# Patient Record
Sex: Female | Born: 1995 | Race: Black or African American | Hispanic: No | Marital: Single | Smoking: Never smoker
Health system: Southern US, Community
[De-identification: ages and names within clinical notes are randomized; demographics above are authoritative.]

## PROBLEM LIST (undated history)

## (undated) DIAGNOSIS — F32A Depression, unspecified: Secondary | ICD-10-CM

## (undated) DIAGNOSIS — J329 Chronic sinusitis, unspecified: Secondary | ICD-10-CM

## (undated) DIAGNOSIS — D509 Iron deficiency anemia, unspecified: Secondary | ICD-10-CM

## (undated) DIAGNOSIS — F411 Generalized anxiety disorder: Secondary | ICD-10-CM

## (undated) DIAGNOSIS — F419 Anxiety disorder, unspecified: Secondary | ICD-10-CM

## (undated) DIAGNOSIS — R42 Dizziness and giddiness: Secondary | ICD-10-CM

## (undated) HISTORY — PX: MOUTH SURGERY: SHX715

## (undated) HISTORY — DX: Depression, unspecified: F32.A

## (undated) HISTORY — PX: OPEN REDUCTION INTERNAL FIXATION (ORIF) HAND: SHX5991

## (undated) HISTORY — DX: Iron deficiency anemia, unspecified: D50.9

## (undated) HISTORY — DX: Anxiety disorder, unspecified: F41.9

## (undated) HISTORY — DX: Generalized anxiety disorder: F41.1

## (undated) HISTORY — PX: CHOLECYSTECTOMY: SHX55

---
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
## 2017-02-28 ENCOUNTER — Encounter: Payer: Self-pay | Admitting: Neurology

## 2017-02-28 ENCOUNTER — Ambulatory Visit (INDEPENDENT_AMBULATORY_CARE_PROVIDER_SITE_OTHER): Payer: Managed Care, Other (non HMO) | Admitting: Neurology

## 2017-02-28 ENCOUNTER — Encounter (INDEPENDENT_AMBULATORY_CARE_PROVIDER_SITE_OTHER): Payer: Self-pay

## 2017-02-28 VITALS — BP 112/75 | HR 83 | Ht 62.0 in | Wt 141.5 lb

## 2017-02-28 DIAGNOSIS — R42 Dizziness and giddiness: Secondary | ICD-10-CM | POA: Insufficient documentation

## 2017-02-28 NOTE — Progress Notes (Signed)
Reason for visit: Vertigo  Referring physician: Dr. Bernerd Limboucker  Chelsea Lowery is a 21 y.o. female  History of present illness:  Chelsea Lowery is an 21 year old right-handed black female with a history of onset of vertigo that began around Easter of 2018. The patient indicated that the episodes are relatively frequent at first, but the episodes have become much less frequent and may occur once a week or so. The episodes last several hours, and are associated with true vertigo, nausea, and generalized sensation of weakness. The patient may lay down and take a nap which seems to help. The patient reports no headache whatsoever before, during, or after the events. She does not have a prior history of headache. The patient may have a feeling of near syncope. 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 The patient has been followed through ENT. She reports no difficulty with balance except during episodes of dizziness, and she denies any problems controlling the bowels or the bladder. The patient has not had any syncope. She reports no loss of vision, double vision, or distortion of vision. She has no slurred speech or difficulty swallowing.  Past Medical History:  Diagnosis Date  . Sinusitis     Past Surgical History:  Procedure Laterality Date  . MOUTH SURGERY    . ORTHOPEDIC SURGERY Left 2009   arm    History reviewed. No pertinent family history.  Social history:  reports that she has never smoked. She has never used smokeless tobacco. She reports that she does not drink alcohol. Her drug history is not on file.  Medications:  Prior to Admission medications   Medication Sig Start Date End Date Taking? Authorizing Provider  cetirizine (ZYRTEC) 10 MG chewable tablet Chew 10 mg by mouth as needed.    Yes [provider]  fluticasone  (FLONASE) 50 MCG/ACT nasal spray Place 2 sprays into both nostrils as needed.    Yes [provider]  ibuprofen (ADVIL,MOTRIN) 200 MG tablet Take 800 mg by mouth as needed.    Yes [provider]  levonorgestrel-ethinyl estradiol (MARLISSA) 0.15-30 MG-MCG tablet Take 1 tablet by mouth daily. 05/16/16  Yes [provider]      Allergies  Allergen Reactions  . Clindamycin/Lincomycin   . Doxycycline Nausea And Vomiting  . Percocet [Oxycodone-Acetaminophen]     ROS:  Out of a complete 14 system review of symptoms, the patient complains only of the following symptoms, and all other reviewed systems are negative.  Fatigue Hearing loss, ringing in the ears, spinning sensations Numbness, weakness, dizziness Anxiety, not enough sleep, decreased energy Insomnia, sleepiness  Blood pressure 112/75, pulse 83, height 5\' 2"  (1.575 m), weight 141 lb 8 oz (64.2 kg).  Physical Exam  General: The patient is alert and cooperative at the time of the examination.  Eyes: Pupils are equal, round, and reactive to light. Discs are flat bilaterally.  Ears: Tympanic membranes are clear bilaterally.  Neck: The neck is supple, no carotid bruits are noted.  Respiratory: The respiratory examination is clear.  Cardiovascular: The cardiovascular examination reveals a regular rate and rhythm, no obvious murmurs or rubs are noted.  Skin: Extremities are without significant edema.  Neurologic Exam  Mental status: The patient is alert and oriented x 3 at the time of the examination. The patient has apparent normal recent and remote memory,  with an apparently normal attention span and concentration ability.  Cranial nerves: Facial symmetry is present. There is good sensation of the face to pinprick and soft touch bilaterally. The strength of the facial muscles and the muscles to head turning and shoulder shrug are normal bilaterally. Speech is well enunciated, no aphasia or dysarthria  is noted. Extraocular movements are full. Visual fields are full. The tongue is midline, and the patient has symmetric elevation of the soft palate. No obvious hearing deficits are noted.  Motor: The motor testing reveals 5 over 5 strength of all 4 extremities. Good symmetric motor tone is noted throughout.  Sensory: Sensory testing is intact to pinprick, soft touch, vibration sensation, and position sense on all 4 extremities. No evidence of extinction is noted.  Coordination: Cerebellar testing reveals good finger-nose-finger and heel-to-shin bilaterally.  Gait and station: Gait is normal. Tandem gait is normal. Romberg is negative. No drift is seen.  Reflexes: Deep tendon reflexes are symmetric and normal bilaterally. Toes are downgoing bilaterally.   Assessment/Plan:  1. Episodic vertigo  The patient palate does not have any evidence of a peripheral vestibular dysfunction by examination. The patient will undergo MRI of the brain to exclude demyelinating disease. If the episodes of dizziness continue, we will consider treatment with Topamax for possible migraine equivalent events. The patient will follow-up in 4 months. The clinical examination today is normal.  Marlan Palau MD 02/28/2017 11:50 AM  [facility] [address] [place], [place] [address]  Phone [phone] Fax [phone]

## 2017-02-28 NOTE — Patient Instructions (Signed)
    We will check MRI of the brain. 

## 2017-03-19 ENCOUNTER — Other Ambulatory Visit: Payer: Managed Care, Other (non HMO)

## 2017-03-22 ENCOUNTER — Other Ambulatory Visit: Payer: Managed Care, Other (non HMO)

## 2017-04-02 ENCOUNTER — Ambulatory Visit
Admission: RE | Admit: 2017-04-02 | Discharge: 2017-04-02 | Disposition: A | Discharge status: Home / Self Care | Payer: Managed Care, Other (non HMO) | Source: Ambulatory Visit | Attending: Neurology | Admitting: Neurology

## 2017-04-02 DIAGNOSIS — R42 Dizziness and giddiness: Secondary | ICD-10-CM | POA: Diagnosis not present

## 2017-04-02 MED ORDER — GADOBENATE DIMEGLUMINE 529 MG/ML IV SOLN
13.0000 mL | Freq: Once | INTRAVENOUS | Status: AC | PRN
  Administered 2017-04-02: 13 mL via INTRAVENOUS

## 2017-04-03 ENCOUNTER — Telehealth: Payer: Self-pay | Admitting: Neurology

## 2017-04-03 MED ORDER — TOPIRAMATE 25 MG PO TABS: ORAL_TABLET | ORAL | 3 refills | Status: DC

## 2017-04-03 NOTE — Telephone Encounter (Signed)
  I called patient. MRI the brain is normal. We will treat the patient empirically for migraine equivalent as an etiology for the vertigo. I will start Topamax. The patient will call for any dose adjustments.  MRI brain 04/02/17:  IMPRESSION:   This is a normal MRI of the brain with and without contrast.

## 2017-08-06 ENCOUNTER — Ambulatory Visit: Payer: Managed Care, Other (non HMO) | Admitting: Neurology

## 2020-03-12 ENCOUNTER — Encounter (HOSPITAL_COMMUNITY): Payer: Self-pay | Admitting: *Deleted

## 2020-03-12 ENCOUNTER — Inpatient Hospital Stay (HOSPITAL_COMMUNITY)
Admission: AD | Admit: 2020-03-12 | Discharge: 2020-03-12 | Disposition: A | Discharge status: Home / Self Care | Payer: No Typology Code available for payment source | Attending: Obstetrics & Gynecology | Admitting: Obstetrics & Gynecology

## 2020-03-12 DIAGNOSIS — Z881 Allergy status to other antibiotic agents status: Secondary | ICD-10-CM | POA: Diagnosis not present

## 2020-03-12 DIAGNOSIS — Z885 Allergy status to narcotic agent status: Secondary | ICD-10-CM | POA: Diagnosis not present

## 2020-03-12 DIAGNOSIS — R1110 Vomiting, unspecified: Secondary | ICD-10-CM | POA: Diagnosis present

## 2020-03-12 DIAGNOSIS — O219 Vomiting of pregnancy, unspecified: Secondary | ICD-10-CM | POA: Insufficient documentation

## 2020-03-12 DIAGNOSIS — Z3A01 Less than 8 weeks gestation of pregnancy: Secondary | ICD-10-CM | POA: Diagnosis not present

## 2020-03-12 HISTORY — DX: Dizziness and giddiness: R42

## 2020-03-12 LAB — URINALYSIS, ROUTINE W REFLEX MICROSCOPIC
Bilirubin Urine: NEGATIVE
Glucose, UA: NEGATIVE mg/dL
Hgb urine dipstick: NEGATIVE
Ketones, ur: NEGATIVE mg/dL
Nitrite: NEGATIVE
Protein, ur: NEGATIVE mg/dL
Specific Gravity, Urine: 1.018 (ref 1.005–1.030)
pH: 6 (ref 5.0–8.0)

## 2020-03-12 LAB — POCT PREGNANCY, URINE: Preg Test, Ur: POSITIVE — AB

## 2020-03-12 MED ORDER — PROMETHAZINE HCL 25 MG/ML IJ SOLN
25.0000 mg | Freq: Once | INTRAMUSCULAR | Status: AC
  Administered 2020-03-12: 20:00:00 25 mg via INTRAMUSCULAR
  Filled 2020-03-12: qty 1

## 2020-03-12 MED ORDER — SCOPOLAMINE 1 MG/3DAYS TD PT72
1.0000 | MEDICATED_PATCH | TRANSDERMAL | Status: DC
  Administered 2020-03-12: 20:00:00 1.5 mg via TRANSDERMAL
  Filled 2020-03-12: qty 1

## 2020-03-12 MED ORDER — PROMETHAZINE HCL 25 MG PO TABS: 25.0000 mg | ORAL_TABLET | Freq: Four times a day (QID) | ORAL | 0 refills | Status: DC | PRN

## 2020-03-12 NOTE — MAU Note (Signed)
Hx of vertigo, was taking Meclizine.  Having n/v.  Hasn't been able to eat for a couple days, can't even work.  Wakes up when trying to sleep, feeling nausea.  Has tried some OTC meds, nothing is helping. Has had initial/confirmation of pregnancy. Was instructed to come here.

## 2020-03-12 NOTE — MAU Provider Note (Addendum)
History     CSN: [id number]  Arrival date and time: 03/12/20 1724   First Provider Initiated Contact with Patient 03/12/20 1833      Chief Complaint  Patient presents with  . Emesis  . Nausea  . Dizziness   HPI   Chelsea Lowery is a 24 y.o. female G1P0000  @[redacted]w[redacted]d  here in MAU with N/v. The symptoms started when she found out she was pregnant. The symptoms worsened today. She has had nausea everyday and today she started vomiting. She has vomited 2x today. She has tried some OTC medications meclozine which is not helping. She has no pain or bleeding.   OB History     Gravida  1   Para  0   Term  0   Preterm  0   AB  0   Living  0      SAB  0   TAB  0   Ectopic  0   Multiple  0   Live Births  0           Past Medical History:  Diagnosis Date  . Sinusitis   . Vertigo     Past Surgical History:  Procedure Laterality Date  . MOUTH SURGERY    . OPEN REDUCTION INTERNAL FIXATION (ORIF) HAND    . ORTHOPEDIC SURGERY Left 2009   arm    History reviewed. No pertinent family history.  Social History   Tobacco Use  . Smoking status: Never Smoker  . Smokeless tobacco: Never Used  Vaping Use  . Vaping Use: Never used  Substance Use Topics  . Alcohol use: Not Currently  . Drug use: Yes    Types: Marijuana    Comment: last used approx  03-06-20    Allergies:  Allergies  Allergen Reactions  . Clindamycin/Lincomycin   . Doxycycline Nausea And Vomiting  . Percocet [Oxycodone-Acetaminophen]     Medications Prior to Admission  Medication Sig Dispense Refill Last Dose  . cetirizine (ZYRTEC) 10 MG chewable tablet Chew 10 mg by mouth as needed.    Past Month at Unknown time  . fluticasone (FLONASE) 50 MCG/ACT nasal spray Place 2 sprays into both nostrils as needed.    Past Month at Unknown time  . Prenatal Vit-Fe Fumarate-FA (PRENATAL MULTIVITAMIN) TABS tablet Take 1 tablet by mouth daily at 12 noon.   03/12/2020 at Unknown time  . ibuprofen (ADVIL,MOTRIN)  200 MG tablet Take 800 mg by mouth as needed.      03/14/2020 levonorgestrel-ethinyl estradiol (MARLISSA) 0.15-30 MG-MCG tablet Take 1 tablet by mouth daily.     01-25-2004 topiramate (TOPAMAX) 25 MG tablet Take one tablet at night for one week, then take 2 tablets at night 60 tablet 3    Results for orders placed or performed during the hospital encounter of 03/12/20 (from the past 48 hour(s))  Pregnancy, urine POC     Status: Abnormal   Collection Time: 03/12/20  6:26 PM  Result Value Ref Range   Preg Test, Ur POSITIVE (A) NEGATIVE    Comment:        THE SENSITIVITY OF THIS METHODOLOGY IS >24 mIU/mL    Review of Systems  Constitutional: Negative for fever.  Gastrointestinal: Positive for nausea and vomiting. Negative for abdominal pain.  Genitourinary: Negative for vaginal bleeding.   Physical Exam   Blood pressure 126/63, pulse (!) 106, temperature 98.4 F (36.9 C), temperature source Oral, resp. rate 18, height 5\' 3"  (1.6 m), weight 193 lb 12.8 oz (87.9  kg), last menstrual period 01/31/2020, SpO2 100 %.  Physical Exam Vitals and nursing note reviewed.  Constitutional:      General: She is not in acute distress.    Appearance: Normal appearance. She is not ill-appearing.  HENT:     Head: Normocephalic.  Pulmonary:     Effort: Pulmonary effort is normal.  Musculoskeletal:        General: Normal range of motion.  Skin:    General: Skin is warm.  Neurological:     Mental Status: She is alert.  Psychiatric:        Mood and Affect: Mood normal.    MAU Course  Procedures  Results for orders placed or performed during the hospital encounter of 03/12/20 (from the past 24 hour(s))  Pregnancy, urine POC     Status: Abnormal   Collection Time: 03/12/20  6:26 PM  Result Value Ref Range   Preg Test, Ur POSITIVE (A) NEGATIVE  Urinalysis, Routine w reflex microscopic     Status: Abnormal   Collection Time: 03/12/20  6:27 PM  Result Value Ref Range   Color, Urine YELLOW YELLOW   APPearance  CLEAR CLEAR   Specific Gravity, Urine 1.018 1.005 - 1.030   pH 6.0 5.0 - 8.0   Glucose, UA NEGATIVE NEGATIVE mg/dL   Hgb urine dipstick NEGATIVE NEGATIVE   Bilirubin Urine NEGATIVE NEGATIVE   Ketones, ur NEGATIVE NEGATIVE mg/dL   Protein, ur NEGATIVE NEGATIVE mg/dL   Nitrite NEGATIVE NEGATIVE   Leukocytes,Ua SMALL (A) NEGATIVE   RBC / HPF 0-5 0 - 5 RBC/hpf   WBC, UA 6-10 0 - 5 WBC/hpf   Bacteria, UA RARE (A) NONE SEEN   Squamous Epithelial / LPF 0-5 0 - 5   Mucus PRESENT      MDM  Delay in urine results Scopolamine patch applied Phenergan 25 mg IM given Waiting for UA results.  Report given to Jorje Guild NP who resumes care of the patient at [facility], [name], NP  No vomiting in MAU U/a shows no indication of dehydration Reports improvement in symptoms after IM phenergan & would like Rx for it  Assessment and Plan  A: 1. Nausea and vomiting during pregnancy prior to [redacted] weeks gestation    P: Discharge home Rx phenergan Discussed reasons to return to MAU Keep appointment with OB  Jorje Guild, NP

## 2020-03-12 NOTE — Discharge Instructions (Signed)

## 2020-04-04 ENCOUNTER — Other Ambulatory Visit: Payer: Self-pay

## 2020-04-04 ENCOUNTER — Inpatient Hospital Stay (HOSPITAL_COMMUNITY): Payer: No Typology Code available for payment source

## 2020-04-04 ENCOUNTER — Inpatient Hospital Stay (HOSPITAL_COMMUNITY)
Admission: AD | Admit: 2020-04-04 | Discharge: 2020-04-04 | Disposition: A | Discharge status: Home / Self Care | Payer: No Typology Code available for payment source | Attending: Obstetrics and Gynecology | Admitting: Obstetrics and Gynecology

## 2020-04-04 ENCOUNTER — Encounter (HOSPITAL_COMMUNITY): Payer: Self-pay | Admitting: Obstetrics and Gynecology

## 2020-04-04 DIAGNOSIS — O99322 Drug use complicating pregnancy, second trimester: Secondary | ICD-10-CM | POA: Insufficient documentation

## 2020-04-04 DIAGNOSIS — O209 Hemorrhage in early pregnancy, unspecified: Secondary | ICD-10-CM

## 2020-04-04 DIAGNOSIS — R109 Unspecified abdominal pain: Secondary | ICD-10-CM | POA: Diagnosis not present

## 2020-04-04 DIAGNOSIS — Z3A09 9 weeks gestation of pregnancy: Secondary | ICD-10-CM | POA: Insufficient documentation

## 2020-04-04 DIAGNOSIS — F1290 Cannabis use, unspecified, uncomplicated: Secondary | ICD-10-CM | POA: Insufficient documentation

## 2020-04-04 DIAGNOSIS — Z79899 Other long term (current) drug therapy: Secondary | ICD-10-CM | POA: Diagnosis not present

## 2020-04-04 DIAGNOSIS — O3441 Maternal care for other abnormalities of cervix, first trimester: Secondary | ICD-10-CM | POA: Insufficient documentation

## 2020-04-04 DIAGNOSIS — N841 Polyp of cervix uteri: Secondary | ICD-10-CM | POA: Diagnosis not present

## 2020-04-04 DIAGNOSIS — O418X10 Other specified disorders of amniotic fluid and membranes, first trimester, not applicable or unspecified: Secondary | ICD-10-CM | POA: Diagnosis not present

## 2020-04-04 DIAGNOSIS — O4691 Antepartum hemorrhage, unspecified, first trimester: Secondary | ICD-10-CM

## 2020-04-04 DIAGNOSIS — O26891 Other specified pregnancy related conditions, first trimester: Secondary | ICD-10-CM | POA: Diagnosis not present

## 2020-04-04 DIAGNOSIS — O468X1 Other antepartum hemorrhage, first trimester: Secondary | ICD-10-CM

## 2020-04-04 DIAGNOSIS — O4690 Antepartum hemorrhage, unspecified, unspecified trimester: Secondary | ICD-10-CM

## 2020-04-04 DIAGNOSIS — O208 Other hemorrhage in early pregnancy: Secondary | ICD-10-CM | POA: Diagnosis present

## 2020-04-04 LAB — WET PREP, GENITAL
Clue Cells Wet Prep HPF POC: NONE SEEN
Sperm: NONE SEEN
Trich, Wet Prep: NONE SEEN
Yeast Wet Prep HPF POC: NONE SEEN

## 2020-04-04 LAB — HCG, QUANTITATIVE, PREGNANCY: hCG, Beta Chain, Quant, S: 121999 m[IU]/mL — ABNORMAL HIGH (ref <5)

## 2020-04-04 LAB — CBC
HCT: 38.9 % (ref 36.0–46.0)
Hemoglobin: 12.2 g/dL (ref 12.0–15.0)
MCH: 26.1 pg (ref 26.0–34.0)
MCHC: 31.4 g/dL (ref 30.0–36.0)
MCV: 83.1 fL (ref 80.0–100.0)
Platelets: 352 10*3/uL (ref 150–400)
RBC: 4.68 MIL/uL (ref 3.87–5.11)
RDW: 12.8 % (ref 11.5–15.5)
WBC: 8.5 10*3/uL (ref 4.0–10.5)
nRBC: 0 % (ref 0.0–0.2)

## 2020-04-04 LAB — ABO/RH: ABO/RH(D): O POS

## 2020-04-04 NOTE — Discharge Instructions (Signed)

## 2020-04-04 NOTE — MAU Provider Note (Signed)
History     CSN: [id number]  Arrival date and time: 04/04/20 0320   First Provider Initiated Contact with Patient 04/04/20 [phone]      Chief Complaint  Patient presents with   Vaginal Bleeding   Chelsea Lowery is a 24 y.o. G1P0 at [redacted]w[redacted]d who receives care at [facility].  She presents today for Vaginal Bleeding.  She reports she was in the bathroom having a bowel movement and noticed blood in the toilet upon completion.  Patient states that she further noticed blood upon wiping.  She reports some "minor" right side cramping that started around the same time of the bleeding which was 0230. Patient reports some dark blood upon arrival to the MAU and the cramps continue, but are improving.  Patient denies sexual activity in the past 3 days.    OB History    Gravida  1   Para  0   Term  0   Preterm  0   AB  0   Living  0     SAB  0   TAB  0   Ectopic  0   Multiple  0   Live Births  0           Past Medical History:  Diagnosis Date   Sinusitis    Vertigo     Past Surgical History:  Procedure Laterality Date   MOUTH SURGERY     OPEN REDUCTION INTERNAL FIXATION (ORIF) HAND     ORTHOPEDIC SURGERY Left 2009   arm    No family history on file.  Social History   Tobacco Use   Smoking status: Never Smoker   Smokeless tobacco: Never Used  Vaping Use   Vaping Use: Never used  Substance Use Topics   Alcohol use: Not Currently   Drug use: Not Currently    Types: Marijuana    Comment: last used approx  03-06-20    Allergies:  Allergies  Allergen Reactions   Clindamycin/Lincomycin    Doxycycline Nausea And Vomiting   Percocet [Oxycodone-Acetaminophen]     Medications Prior to Admission  Medication Sig Dispense Refill Last Dose   ondansetron (ZOFRAN) 4 MG tablet Take 4 mg by mouth every 8 (eight) hours as needed for nausea or vomiting.   04/04/2020 at 0000   Prenatal Vit-Fe Fumarate-FA (PRENATAL MULTIVITAMIN) TABS tablet Take 1  tablet by mouth daily at 12 noon.   04/04/2020 at Unknown time   cetirizine (ZYRTEC) 10 MG chewable tablet Chew 10 mg by mouth as needed.    More than a month at Unknown time   fluticasone (FLONASE) 50 MCG/ACT nasal spray Place 2 sprays into both nostrils as needed.    More than a month at Unknown time   promethazine (PHENERGAN) 25 MG tablet Take 1 tablet (25 mg total) by mouth every 6 (six) hours as needed for nausea or vomiting. 30 tablet 0     Review of Systems  Constitutional: Negative for chills and fever.  Respiratory: Negative for cough and shortness of breath.   Gastrointestinal: Positive for abdominal pain (Cramping), constipation, nausea (Taking zofran-no current nausea) and vomiting (This morning-relieved with Zofran. ). Negative for diarrhea.  Genitourinary: Positive for vaginal bleeding. Negative for difficulty urinating, dysuria, pelvic pain and vaginal discharge.  Musculoskeletal: Negative for back pain.  Neurological: Positive for dizziness. Negative for light-headedness and headaches.   Physical Exam   Blood pressure (!) 122/49, pulse 86, temperature 98.6 F (37 C), resp. rate 18, last  menstrual period 01/31/2020, SpO2 100 %.  Physical Exam Exam conducted with a chaperone present.  Constitutional:      Appearance: Normal appearance.  HENT:     Head: Normocephalic and atraumatic.  Eyes:     Conjunctiva/sclera: Conjunctivae normal.  Cardiovascular:     Rate and Rhythm: Normal rate and regular rhythm.  Pulmonary:     Effort: Pulmonary effort is normal. No respiratory distress.  Genitourinary:    Labia:        Right: No tenderness or lesion.        Left: No tenderness or lesion.      Vagina: Vaginal discharge present. No bleeding.     Cervix: No cervical motion tenderness, discharge, friability, erythema or cervical bleeding.     Uterus: Enlarged.      Comments: Speculum Exam: -Normal External Genitalia: Non tender, no apparent discharge at introitus.  -Vaginal  Vault: Pink mucosa with good rugae. Scant amt thick whitish pink discharge -wet prep collected -Cervix:Pink, no lesions or cysts. Small flat red Polyp noted protruding from os. Os appears closed and without active bleeding. Small 21mm clot noted-GC/CT collected -Bimanual Exam:  Uterine size c/w dates.  No tenderness.   Musculoskeletal:        General: Normal range of motion.     Cervical back: Normal range of motion.  Skin:    General: Skin is warm and dry.  Neurological:     Mental Status: She is alert and oriented to person, place, and time.  Psychiatric:        Mood and Affect: Mood normal.        Behavior: Behavior normal.        Thought Content: Thought content normal.     MAU Course  Procedures Results for orders placed or performed during the hospital encounter of 04/04/20 (from the past 24 hour(s))  Wet prep, genital     Status: Abnormal   Collection Time: 04/04/20  4:29 AM  Result Value Ref Range   Yeast Wet Prep HPF POC NONE SEEN NONE SEEN   Trich, Wet Prep NONE SEEN NONE SEEN   Clue Cells Wet Prep HPF POC NONE SEEN NONE SEEN   WBC, Wet Prep HPF POC MANY (A) NONE SEEN   Sperm NONE SEEN   ABO/Rh     Status: None   Collection Time: 04/04/20  4:36 AM  Result Value Ref Range   ABO/RH(D) O POS    No rh immune globuloin      NOT A RH IMMUNE GLOBULIN CANDIDATE, PT RH POSITIVE Performed at [facility], [address] [place], [place] [address]   CBC     Status: None   Collection Time: 04/04/20  4:36 AM  Result Value Ref Range   WBC 8.5 4.0 - 10.5 K/uL   RBC 4.68 3.87 - 5.11 MIL/uL   Hemoglobin 12.2 12.0 - 15.0 g/dL   HCT 74.0 36 - 46 %   MCV 83.1 80.0 - 100.0 fL   MCH 26.1 26.0 - 34.0 pg   MCHC 31.4 30.0 - 36.0 g/dL   RDW 81.4 48.1 - 85.6 %   Platelets 352 150 - 400 K/uL   nRBC 0.0 0.0 - 0.2 %  hCG, quantitative, pregnancy     Status: Abnormal   Collection Time: 04/04/20  4:36 AM  Result Value Ref Range   hCG, Beta Chain, Quant, S 121,999 (H) <5 mIU/mL     US OB Comp Less 14 Wks  Result  Date: 04/04/2020 CLINICAL DATA:  Initial evaluation for vaginal bleeding, early pregnancy. EXAM: OBSTETRIC <14 WK ULTRASOUND TECHNIQUE: Transabdominal ultrasound was performed for evaluation of the gestation as well as the maternal uterus and adnexal regions. COMPARISON:  None. FINDINGS: Intrauterine gestational sac: Single Yolk sac:  Present Embryo:  Present Cardiac Activity: Present Heart Rate: 166 bpm CRL: 25.3 mm   9 w 1 d                  [place] EDC: 11/06/2020 Subchorionic hemorrhage: Small subchorionic hemorrhage measuring 1.1 x 0.5 x 0.8 cm without mass effect. Maternal uterus/adnexae: Ovaries within normal limits. No adnexal mass or free fluid. IMPRESSION: 1. Single viable intrauterine pregnancy as above, estimated gestational age [redacted] weeks and 1 day by crown-rump length, with ultrasound EDC of 11/06/2020. 2. Small subchorionic hemorrhage without associated mass effect. 3. No other acute maternal uterine or adnexal abnormality identified. Electronically Signed   By: Rise Mu M.D.   On: 04/04/2020 05:58    MDM Pelvic Exam; Wet Prep and GC/CT Labs: UA, UPT, CBC, hCG, ABO Ultrasound Assessment and Plan  24 year old  G1P0  at 9.1weeks Vaginal bleeding Cervical Polyp  -Reviewed POC with patient. -Exam performed and findings discussed.  -Cultures collected and pending.  -Informed of cervical polyp and reassured that normal variation.  Informed that could be removed after pregnancy, but not during d/t potential for bleeding.  -Labs ordered. -Patient offered and declines pain medication. -Will send for [place] and await results.   Cherre Robins 04/04/2020, 4:12 AM   Reassessment (6:09 AM)  -[place] and Lab results return. -Provider to bedside to discuss findings. -Educated on [facility], what to expect including bleeding, risks for miscarriage, and resolution.  -Patient and FOB questions or concerns addressed. -Encouraged to abstain from sexual activity for  at least 72 hours after each bleeding incident. -Patient verbalizes understanding.  -Encouraged to call or return to MAU if symptoms worsen or with the onset of new symptoms. -Discharged to home in stable condition.  Cherre Robins MSN, CNM Advanced Practice Provider, [facility] [employer] [facility]

## 2020-04-04 NOTE — MAU Note (Addendum)
Chelsea Lowery is a 24 y.o. at [redacted]w[redacted]d here in MAU reporting: VB that started after using the bathroom.  Pt reports feeling weak and dizzy every day Onset of complaint: 0330 Pain score: 0  Vitals:   04/04/20 0339  BP: (!) 122/49  Pulse: 86  Resp: 18  Temp: 98.6 F (37 C)  SpO2: 100%

## 2020-04-05 LAB — GC/CHLAMYDIA PROBE AMP (~~LOC~~) NOT AT ARMC
Chlamydia: NEGATIVE
Comment: NEGATIVE
Comment: NORMAL
Neisseria Gonorrhea: NEGATIVE

## 2020-07-13 ENCOUNTER — Other Ambulatory Visit: Payer: Self-pay | Admitting: Obstetrics & Gynecology

## 2020-07-13 DIAGNOSIS — Z3A23 23 weeks gestation of pregnancy: Secondary | ICD-10-CM

## 2020-07-13 DIAGNOSIS — O99212 Obesity complicating pregnancy, second trimester: Secondary | ICD-10-CM

## 2020-07-26 ENCOUNTER — Other Ambulatory Visit: Payer: Self-pay | Admitting: *Deleted

## 2020-07-26 ENCOUNTER — Ambulatory Visit: Payer: No Typology Code available for payment source | Attending: Obstetrics & Gynecology

## 2020-07-26 ENCOUNTER — Other Ambulatory Visit: Payer: Self-pay

## 2020-07-26 ENCOUNTER — Ambulatory Visit: Payer: No Typology Code available for payment source | Admitting: *Deleted

## 2020-07-26 VITALS — BP 135/66 | HR 116

## 2020-07-26 DIAGNOSIS — Z3A23 23 weeks gestation of pregnancy: Secondary | ICD-10-CM | POA: Insufficient documentation

## 2020-07-26 DIAGNOSIS — O99212 Obesity complicating pregnancy, second trimester: Secondary | ICD-10-CM | POA: Diagnosis not present

## 2020-07-26 DIAGNOSIS — N1330 Unspecified hydronephrosis: Secondary | ICD-10-CM

## 2020-07-26 DIAGNOSIS — Z6841 Body Mass Index (BMI) 40.0 and over, adult: Secondary | ICD-10-CM

## 2020-07-26 NOTE — Progress Notes (Signed)
C/o " lower abd cramping, soreness"

## 2020-09-18 HISTORY — PX: CHOLECYSTECTOMY: SHX55

## 2020-09-20 ENCOUNTER — Ambulatory Visit: Payer: No Typology Code available for payment source | Attending: Obstetrics and Gynecology

## 2020-09-20 ENCOUNTER — Ambulatory Visit: Payer: No Typology Code available for payment source

## 2020-09-26 IMAGING — US US OB COMP LESS 14 WK
1 series · 15 of 19 positions shown · non-contrast
Comparison: None.

CLINICAL DATA: Initial evaluation for vaginal bleeding, early
pregnancy.

EXAM:
OBSTETRIC <14 WK ULTRASOUND
TECHNIQUE: Transabdominal ultrasound was performed for evaluation of the
gestation as well as the maternal uterus and adnexal regions.

[Series 1: us ob comp less 14 wk · 19 acquisitions, 15 frames shown]
[im 1/19]
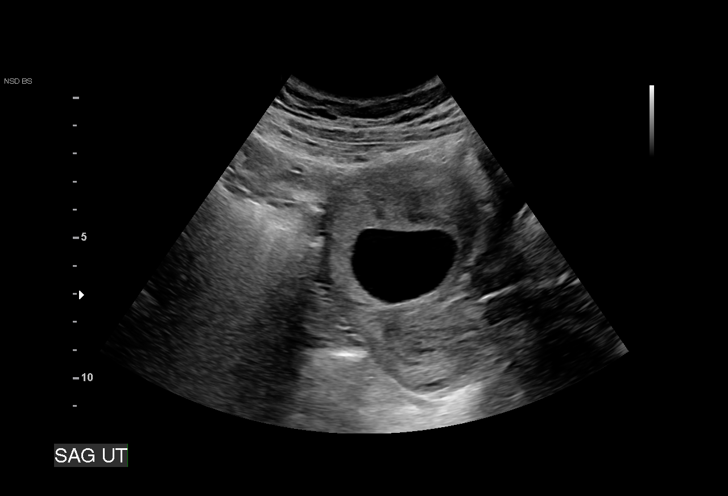
[im 2/19]
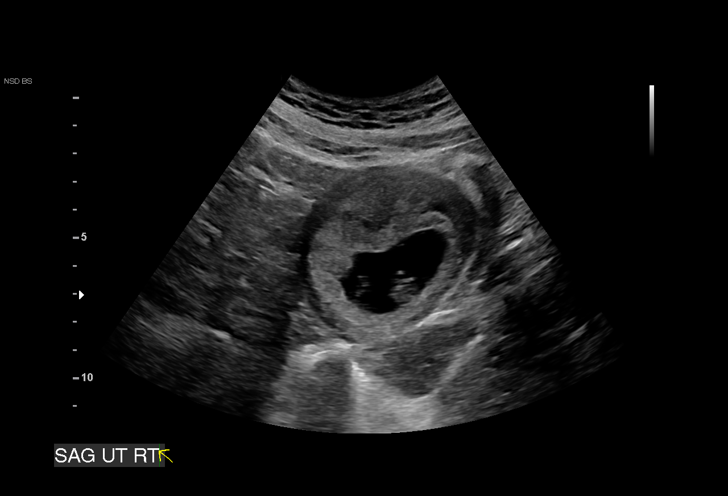
[im 4/19]
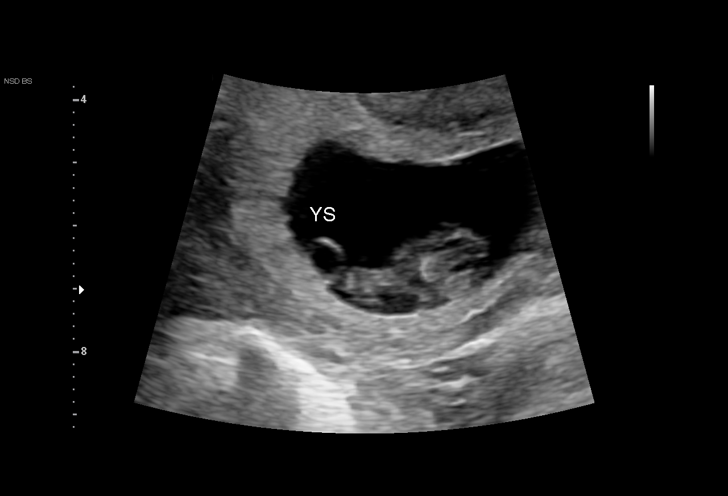
[im 5/19]
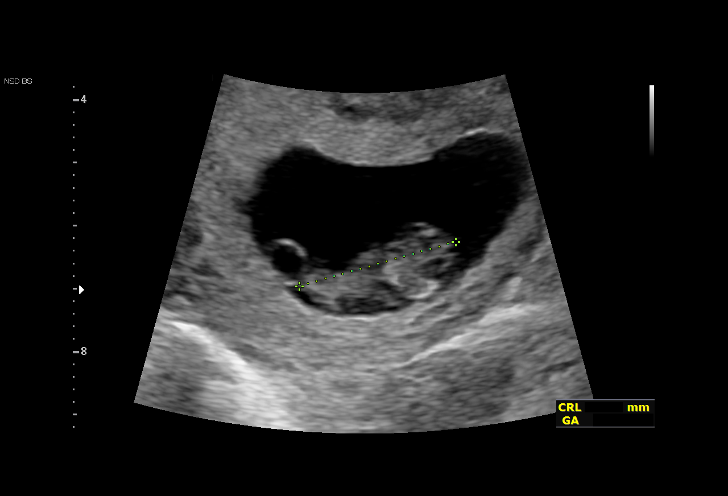
[im 6/19]
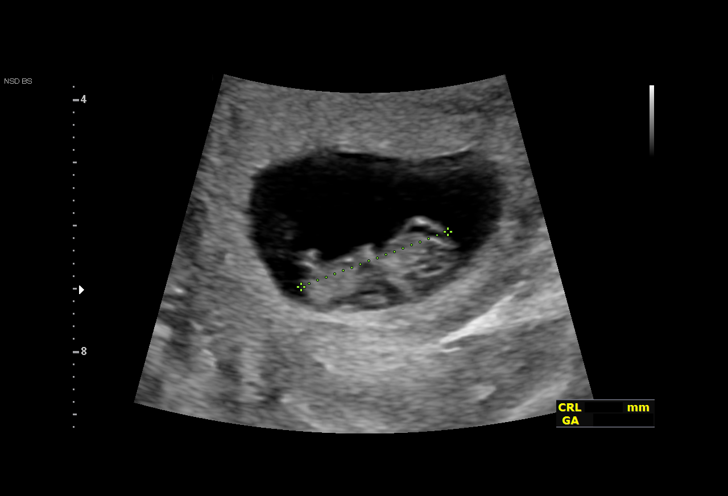
[im 7/19]
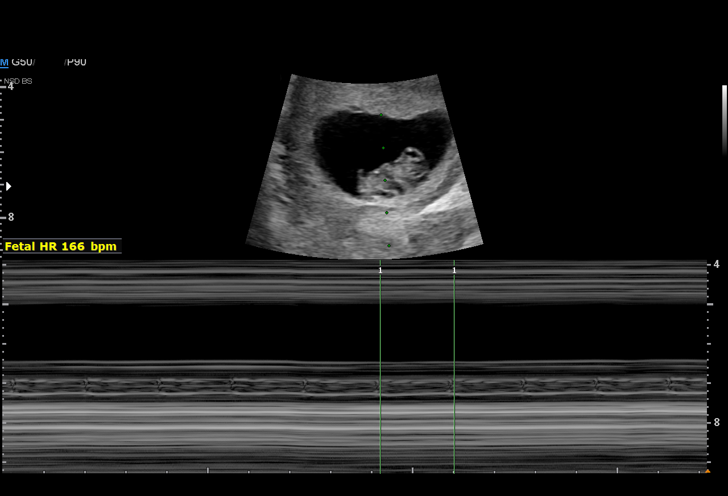
[im 9/19]
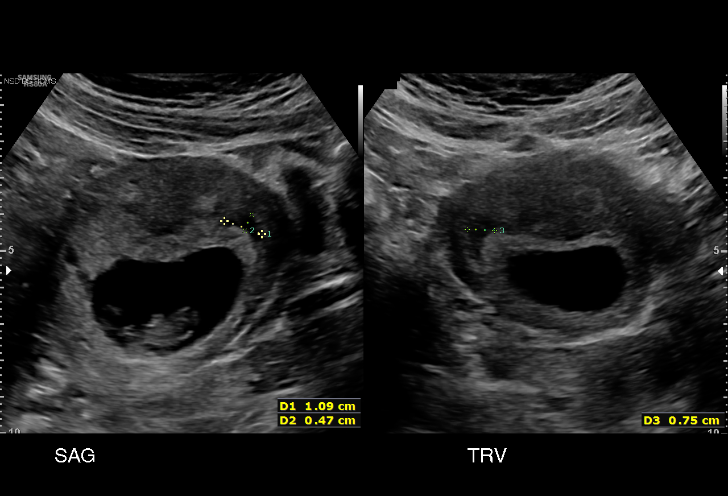
[im 10/19]
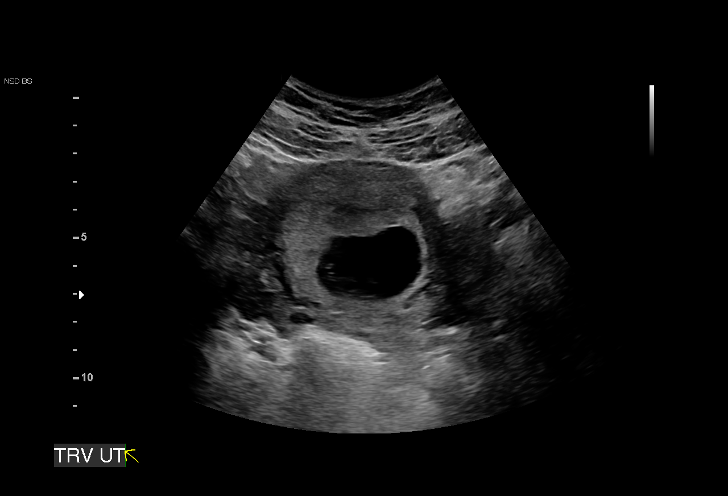
[im 11/19]
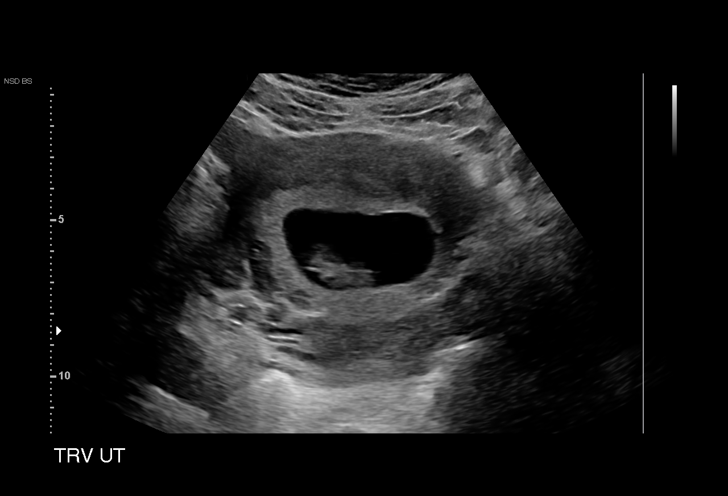
[im 13/19]
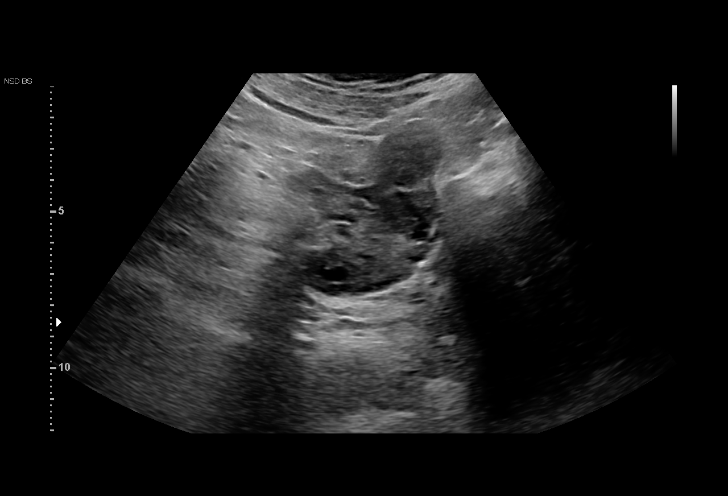
[im 14/19]
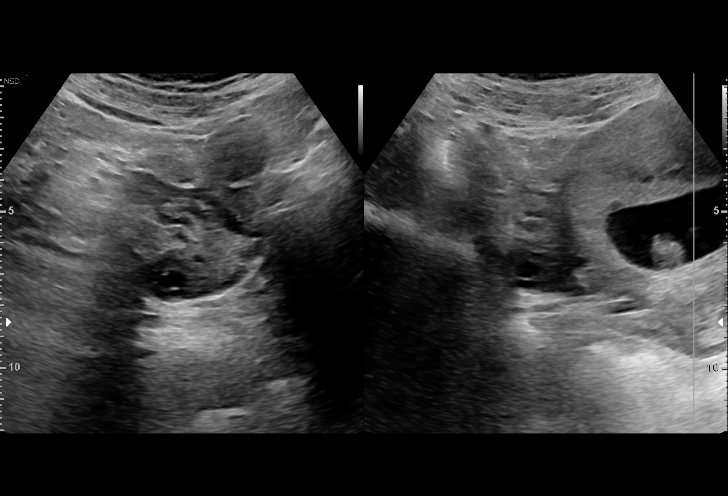
[im 15/19]
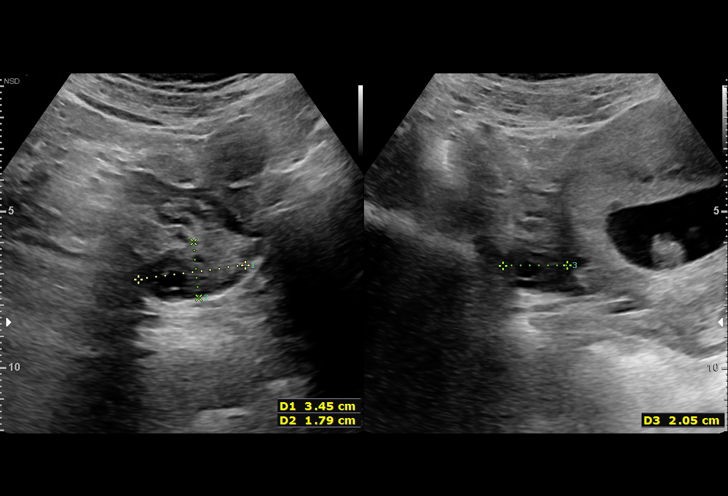
[im 16/19]
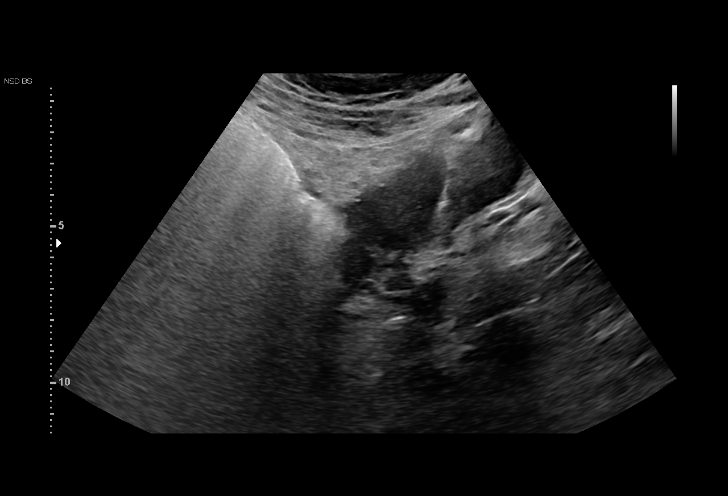
[im 18/19]
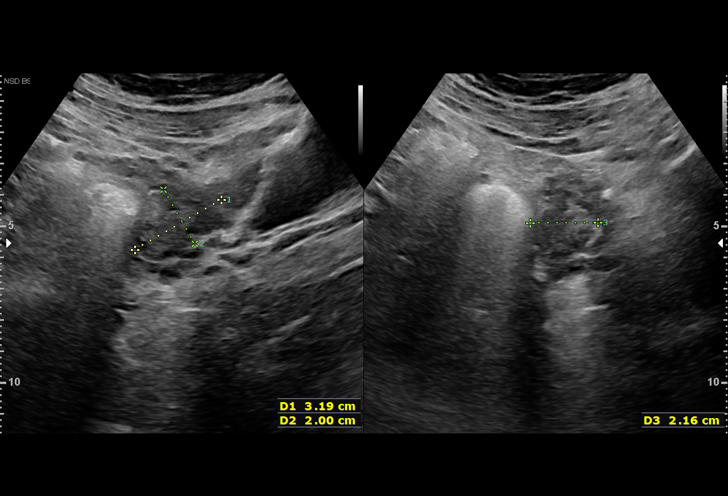
[im 19/19]
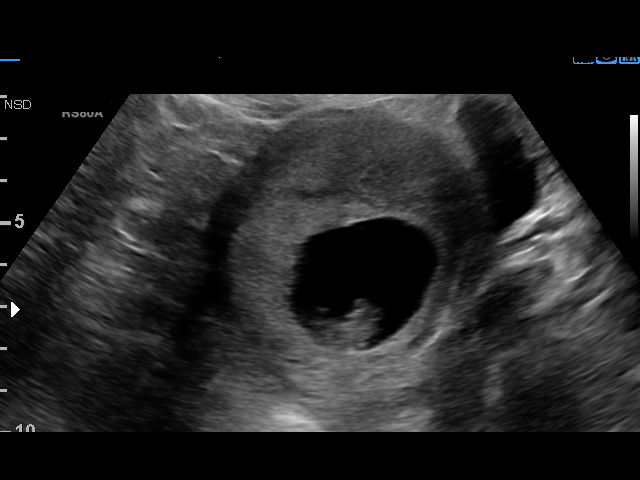

[15 of 19 positions shown; findings below may reference images not displayed]

FINDINGS: Intrauterine gestational sac: Single

Yolk sac:  Present

Embryo:  Present

Cardiac Activity: Present

Heart Rate: 166 bpm

CRL: 25.3 mm   9 w 1 d                  US EDC: 11/06/2020

Subchorionic hemorrhage: Small subchorionic hemorrhage measuring
x 0.5 x 0.8 cm without mass effect.

Maternal uterus/adnexae: Ovaries within normal limits. No adnexal
mass or free fluid.
IMPRESSION: 1. Single viable intrauterine pregnancy as above, estimated
gestational age 9 weeks and 1 day by crown-rump length, with
ultrasound EDC of 11/06/2020.
2. Small subchorionic hemorrhage without associated mass effect.
3. No other acute maternal uterine or adnexal abnormality
identified.

## 2021-01-17 IMAGING — US US MFM OB DETAIL+14 WK
1 series · 13 of 28 positions shown · non-contrast
Comparison: none

[Series 1: us mfm ob detail+14 wk · 67 acquisitions, 13 frames shown]
[im 3/67]
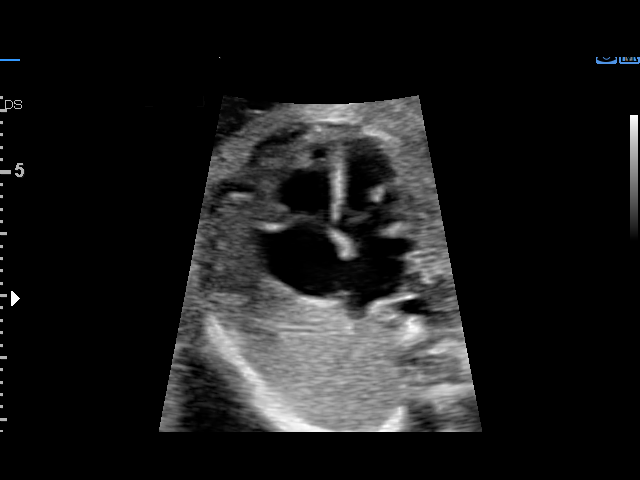
[im 8/67]
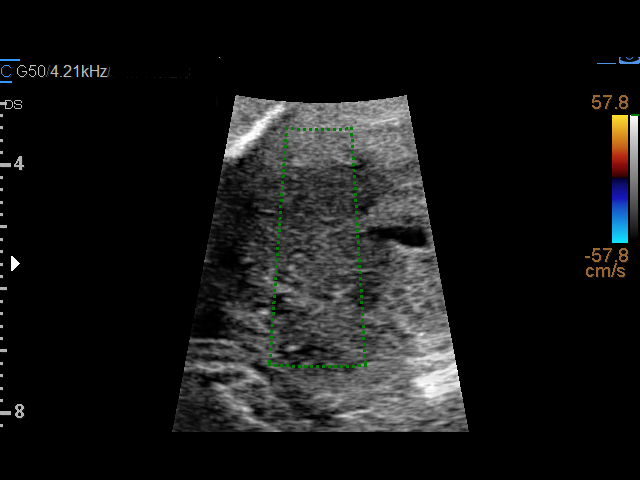
[im 13/67]
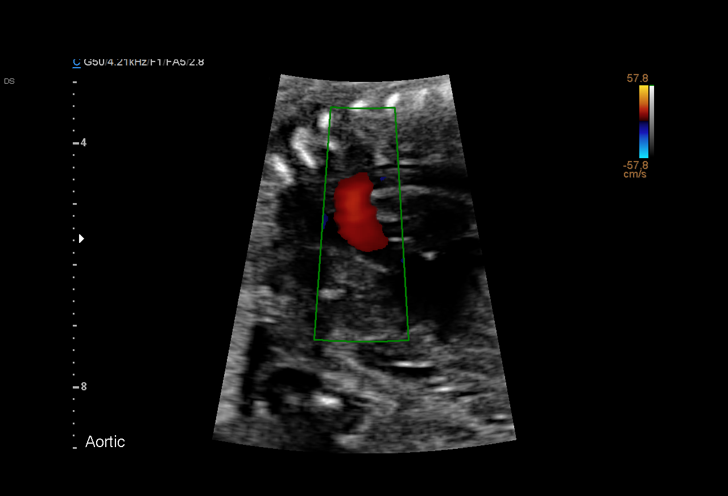
[im 18/67]
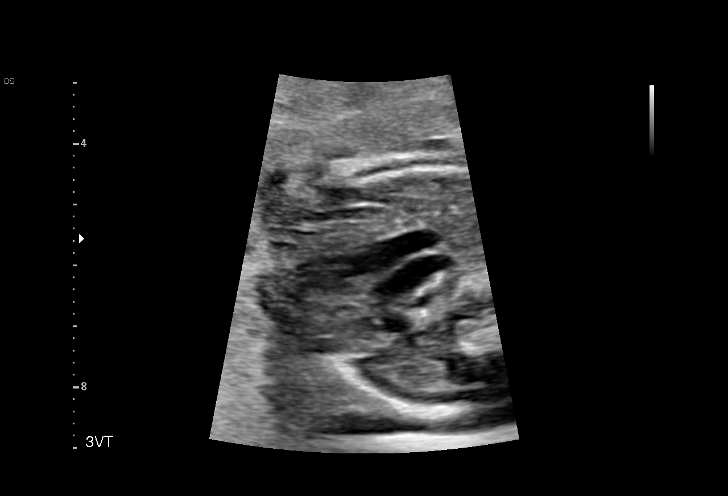
[im 23/67]
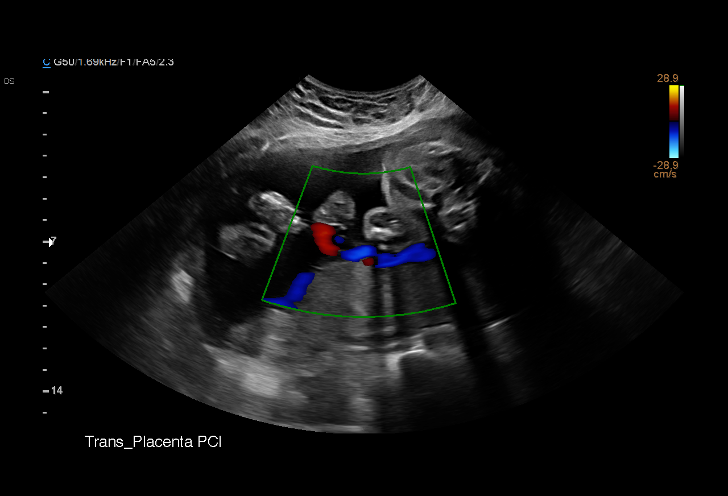
[im 27/67]
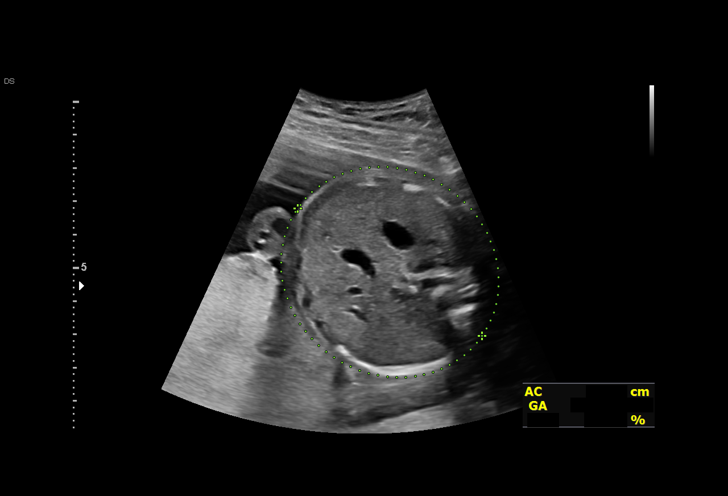
[im 35/67]
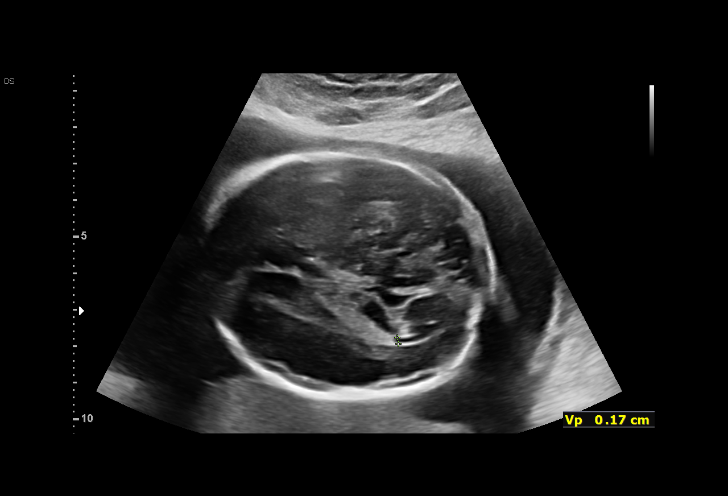
[im 40/67]
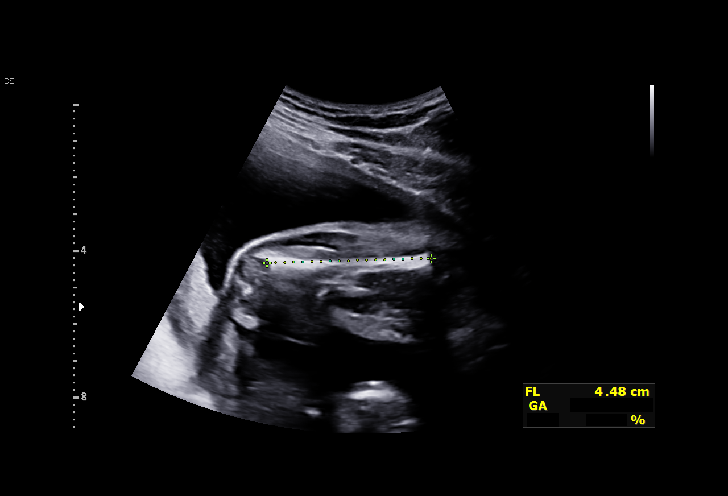
[im 45/67]
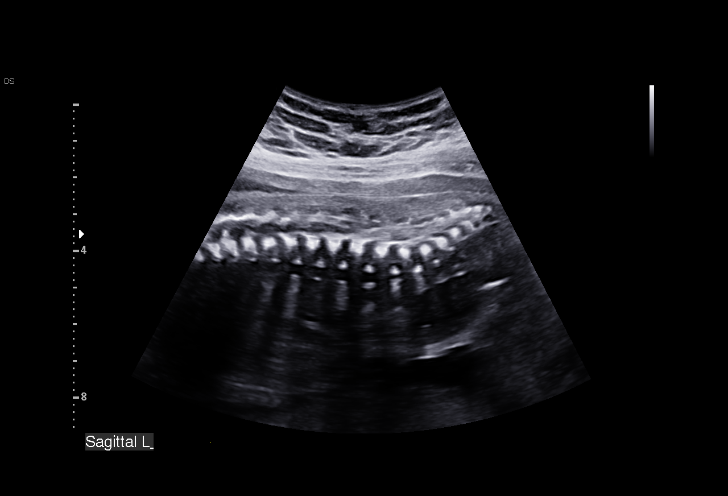
[im 49/67]
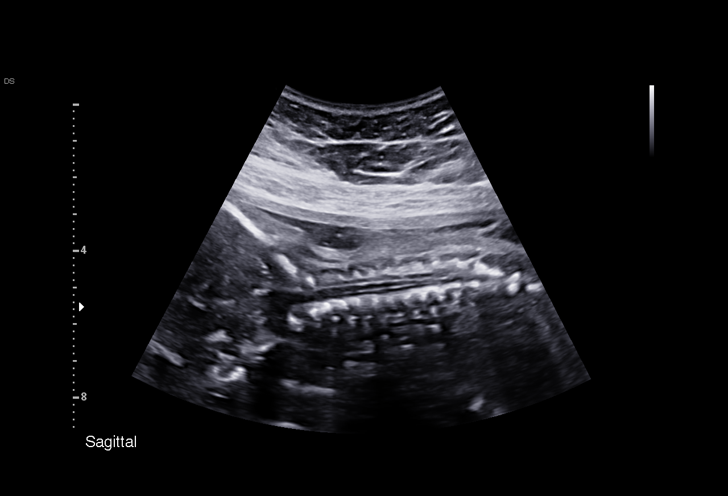
[im 54/67]
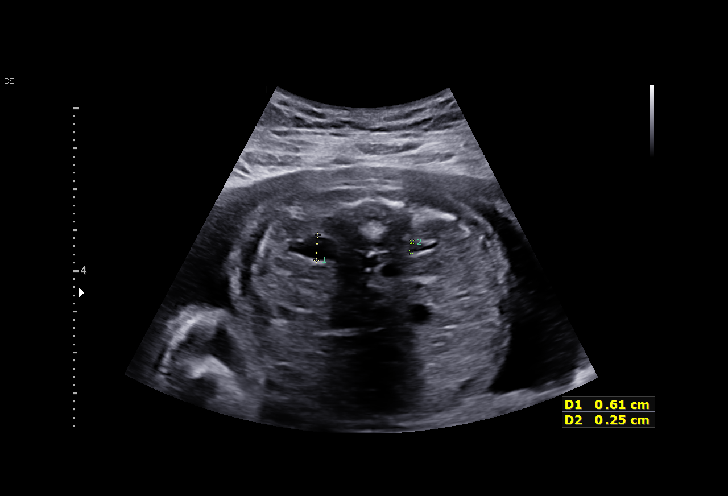
[im 59/67]
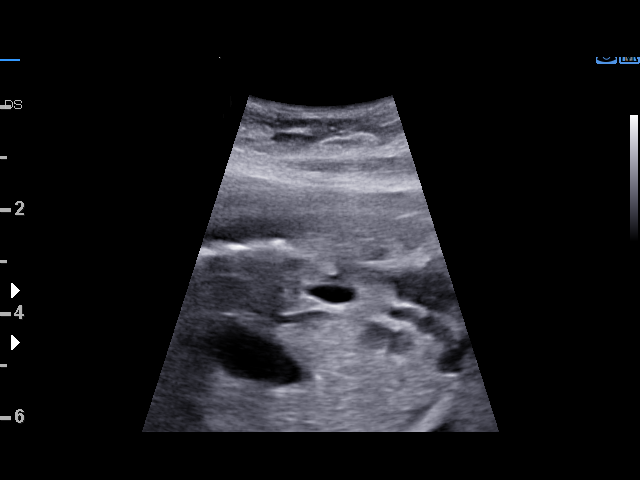
[im 64/67]
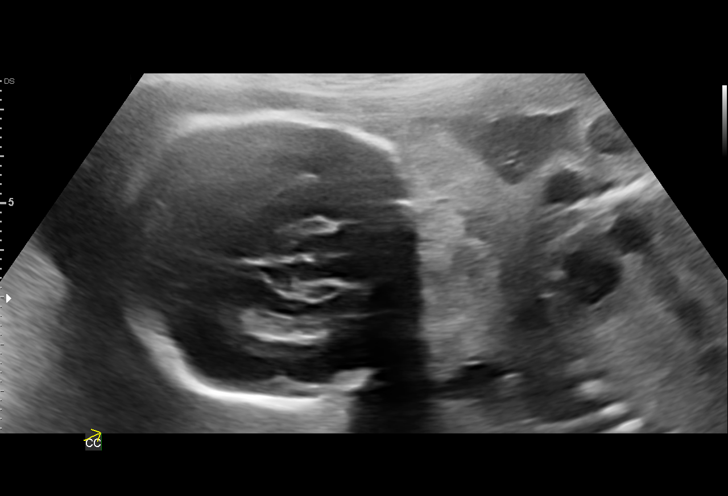

[13 of 28 positions shown; findings below may reference images not displayed]

PATRICA

                                                      PATRICA

Indications

 25 weeks gestation of pregnancy
 Obesity complicating pregnancy, second
 trimester (Pre-G BMI 33)
 Encounter for antenatal screening for
 malformations
 LR - NIPS
Fetal Evaluation

 Num Of Fetuses:         1
 Cardiac Activity:       Observed
 Presentation:           Breech
 Placenta:               Posterior
 P. Cord Insertion:      Visualized, central

 Amniotic Fluid
 AFI FV:      Within normal limits

                             Largest Pocket(cm)
                             7
Biometry

 BPD:      64.3  mm     G. Age:  26w 0d         67  %    CI:        72.55   %    70 - 86
                                                         FL/HC:      19.1   %    18.7 -
 HC:      240.1  mm     G. Age:  26w 1d         56  %    HC/AC:      1.16        1.04 -
 AC:      206.5  mm     G. Age:  25w 2d         39  %    FL/BPD:     71.2   %    71 - 87
 FL:       45.8  mm     G. Age:  25w 1d         33  %    FL/AC:      22.2   %    20 - 24
 CER:      28.9  mm     G. Age:  25w 3d         56  %

 LV:        1.7  mm
 CM:        5.6  mm
 Est. FW:     797  gm    1 lb 12 oz      41  %
OB History

 Gravidity:    1
Gestational Age

 LMP:           25w 2d        Date:  01/31/20                 EDD:   11/06/20
 U/S Today:     25w 5d                                        EDD:   11/03/20
 Best:          25w 2d     Det. By:  LMP  (01/31/20)          EDD:   11/06/20
Anatomy

 Cranium:               Appears normal         LVOT:                   Appears normal
 Cavum:                 Appears normal         Aortic Arch:            Appears normal
 Ventricles:            Appears normal         Ductal Arch:            Appears normal
 Choroid Plexus:        Appears normal         Diaphragm:              Appears normal
 Cerebellum:            Appears normal         Stomach:                Appears normal, left
                                                                       sided
 Posterior Fossa:       Appears normal         Abdomen:                Appears normal
 Nuchal Fold:           Not applicable (>20    Abdominal Wall:         Appears nml (cord
                        wks GA)                                        insert, abd wall)
 Face:                  Appears normal         Cord Vessels:           Appears normal (3
                        (orbits and profile)                           vessel cord)
 Lips:                  Appears normal         Kidneys:                Left UTD
 Palate:                Appears normal         Bladder:                Appears normal
 Thoracic:              Appears normal         Spine:                  Appears normal
 Heart:                 Appears normal         Upper Extremities:      Appears normal
                        (4CH, axis, and
                        situs)
 RVOT:                  Appears normal         Lower Extremities:      Appears normal

 Other:  Fetus appears to be a male. Nasal bone visualized. Heels/feet and
         open hands/5th digits visualized. VC, 3VV and 3VTV visualized.
Cervix Uterus Adnexa

 Cervix
 Length:           3.76  cm.
 Normal appearance by transabdominal scan.

 Right Ovary
 Within normal limits.

 Left Ovary
 Within normal limits.
Myomas

 Site                     L(cm)      W(cm)      D(cm)       Location
 Anterior                 3.2        2.2        2.5         Intramural

 Blood Flow                  RI       PI       Comments

Impression

 G1 P0. Patient is here for fetal anatomy scan.
 On cell-free fetal DNA screening, the risks of fetal
 aneuploidies are not increased .
 She gives history of vaginal bleeding early in pregnancy.  On
 HARSHAL evaluation, cervical polyp was seen.  Patient does not
 give history of recurrent vaginal bleeding.

 We performed fetal anatomy scan.  Left urinary tract dilation
 (UTD) measuring 6 to 7 mm is seen.  Both kidneys appear
 normal with no increased echogenicities.  No other makers of
 aneuploidies or fetal structural defects are seen. Fetal
 biometry is consistent with her previously-established dates.
 Amniotic fluid is normal and good fetal activity is seen.
 A small anterior intramural myoma is seen.
 I explained that urinary tract dilation usually resolves in utero
 or after birth and only rarely associated with obstructive
 uropathy.  It is also a marker for Down syndrome.  However,
 given that she had low risk for Down syndrome on cell free
 fetal DNA screening, this finding should be considered a
 normal variant.
 Patient understands the limitations of ultrasound in detecting
 fetal anomalies.
Recommendations

 -An appointment was made for her to return in 8 weeks for
 fetal growth and renal assessments.
                 Ved, Mundher

## 2023-07-23 DIAGNOSIS — F410 Panic disorder [episodic paroxysmal anxiety] without agoraphobia: Secondary | ICD-10-CM | POA: Insufficient documentation

## 2023-07-23 DIAGNOSIS — F4321 Adjustment disorder with depressed mood: Secondary | ICD-10-CM | POA: Insufficient documentation

## 2023-08-15 DIAGNOSIS — E611 Iron deficiency: Secondary | ICD-10-CM | POA: Insufficient documentation

## 2023-08-15 DIAGNOSIS — R7303 Prediabetes: Secondary | ICD-10-CM | POA: Insufficient documentation

## 2023-09-19 DIAGNOSIS — R7303 Prediabetes: Secondary | ICD-10-CM

## 2023-09-19 HISTORY — DX: Prediabetes: R73.03

## 2023-09-26 DIAGNOSIS — F988 Other specified behavioral and emotional disorders with onset usually occurring in childhood and adolescence: Secondary | ICD-10-CM | POA: Insufficient documentation

## 2023-11-02 DIAGNOSIS — G4700 Insomnia, unspecified: Secondary | ICD-10-CM | POA: Insufficient documentation

## 2024-05-07 ENCOUNTER — Ambulatory Visit: Payer: Self-pay | Admitting: Student

## 2024-05-07 ENCOUNTER — Ambulatory Visit (INDEPENDENT_AMBULATORY_CARE_PROVIDER_SITE_OTHER): Admitting: Student

## 2024-05-07 ENCOUNTER — Encounter: Payer: Self-pay | Admitting: Student

## 2024-05-07 VITALS — BP 110/78 | HR 102 | Temp 98.2°F | Resp 12 | Ht 63.0 in | Wt 206.8 lb

## 2024-05-07 DIAGNOSIS — R7303 Prediabetes: Secondary | ICD-10-CM | POA: Diagnosis not present

## 2024-05-07 DIAGNOSIS — F411 Generalized anxiety disorder: Secondary | ICD-10-CM | POA: Diagnosis not present

## 2024-05-07 DIAGNOSIS — D508 Other iron deficiency anemias: Secondary | ICD-10-CM

## 2024-05-07 DIAGNOSIS — Z7689 Persons encountering health services in other specified circumstances: Secondary | ICD-10-CM

## 2024-05-07 DIAGNOSIS — Z124 Encounter for screening for malignant neoplasm of cervix: Secondary | ICD-10-CM

## 2024-05-07 DIAGNOSIS — Z6836 Body mass index (BMI) 36.0-36.9, adult: Secondary | ICD-10-CM

## 2024-05-07 DIAGNOSIS — E66812 Obesity, class 2: Secondary | ICD-10-CM | POA: Diagnosis not present

## 2024-05-07 DIAGNOSIS — E559 Vitamin D deficiency, unspecified: Secondary | ICD-10-CM

## 2024-05-07 LAB — CBC
HCT: 39.9 % (ref 36.0–46.0)
Hemoglobin: 12.9 g/dL (ref 12.0–15.0)
MCHC: 32.4 g/dL (ref 30.0–36.0)
MCV: 82.5 fl (ref 78.0–100.0)
Platelets: 358 K/uL (ref 150.0–400.0)
RBC: 4.84 Mil/uL (ref 3.87–5.11)
RDW: 13.2 % (ref 11.5–15.5)
WBC: 5.8 K/uL (ref 4.0–10.5)

## 2024-05-07 LAB — TSH: TSH: 1.76 u[IU]/mL (ref 0.35–5.50)

## 2024-05-07 LAB — HEMOGLOBIN A1C: Hgb A1c MFr Bld: 6.1 % (ref 4.6–6.5)

## 2024-05-07 LAB — B12 AND FOLATE PANEL
Folate: 16.3 ng/mL (ref >5.9)
Vitamin B-12: 332 pg/mL (ref 211–911)

## 2024-05-07 LAB — VITAMIN D 25 HYDROXY (VIT D DEFICIENCY, FRACTURES): VITD: 13.2 ng/mL — ABNORMAL LOW (ref 30.00–100.00)

## 2024-05-07 MED ORDER — VITAMIN D3 25 MCG (1000 UT) PO CAPS: 1000.0000 [IU] | ORAL_CAPSULE | Freq: Every day | ORAL | 0 refills | Status: DC

## 2024-05-07 MED ORDER — VITAMIN D3 25 MCG (1000 UT) PO CAPS: 1000.0000 [IU] | ORAL_CAPSULE | Freq: Every day | ORAL | 0 refills | Status: AC

## 2024-05-07 MED ORDER — VITAMIN D (ERGOCALCIFEROL) 1.25 MG (50000 UNIT) PO CAPS: 50000.0000 [IU] | ORAL_CAPSULE | ORAL | 0 refills | Status: DC

## 2024-05-07 NOTE — Patient Instructions (Signed)

## 2024-05-07 NOTE — Progress Notes (Signed)
 Chief Complaint  Patient presents with   Establish Care   Dizziness       New Patient Visit SUBJECTIVE: HPI: Chelsea Lowery is an 28 y.o.female who is being seen for establishing care. Presents with her 2 children- girl and boy- 2 and 3. Has a fiance- he has history of seizures, often caretaking for fianc.  She and her fianc moved recently to [facility] form [employer].  Reports having multiple stressors.  Patient presents with multiple complaints.  Patient reports chronic dizziness occurring multiple times per week, which she states has been a longstanding issue.  Patient reports that she does not hydrate adequately, usually <5 bottles of water per day.  She also expresses concern about an inability to lose weight despite trying various dieting techniques without success; she remains active caring for her young children but acknowledges she could improve her physical activity level and nutrition. Additionally, the patient reports her current anxiety and depression medications are not working as effectively as before.  She is currently on Wellbutrin and Lexapro.  She has a longstanding history of anxiety and depression, with prior psychological evaluations and a reported diagnosis of mood disorder/bipolar II. She attributes part of her current mood symptoms to situational stressors and denies suicidal or homicidal ideation.  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   Insomnia-Trazodone 25 to 100 mg at bedtime, taking 100 mg HS  Iron Deficiency Anemia- 65 mg daily taking daily    BMI Readings from Last 1 Encounters:  05/07/24 36.63 kg/m      Past Medical History:  Diagnosis Date   Sinusitis    Vertigo     Past Surgical History:  Procedure Laterality Date   CESAREAN SECTION     had 2   CHOLECYSTECTOMY     MOUTH SURGERY     OPEN REDUCTION INTERNAL FIXATION (ORIF) HAND     ORTHOPEDIC SURGERY Left 2009   arm   Family History  Problem Relation Age of Onset   Anxiety disorder Mother    Hypertension Mother    Anxiety disorder Sister    Asthma Maternal Aunt    Hypertension Maternal Aunt    Asthma Maternal Uncle    Asthma Maternal Grandmother    Hypertension Maternal Grandmother    Diabetes Maternal Grandmother    Asthma Paternal Grandmother    Allergies  Allergen Reactions   Clindamycin/Lincomycin    Doxycycline  Nausea And Vomiting    Current Outpatient Medications:    buPROPion (WELLBUTRIN SR) 150 MG 12 hr tablet, Take 300 mg by mouth every morning., Disp: , Rfl:    escitalopram (LEXAPRO) 20 MG tablet, Take 20 mg by mouth daily., Disp: , Rfl:    levonorgestrel (MIRENA) 20 MCG/DAY [name], 1 each by Intrauterine route once., Disp: , Rfl:    traZODone (DESYREL) 50 MG tablet, Take 25-100 mg by mouth at bedtime., Disp: , Rfl:    valACYclovir (VALTREX) 500 MG tablet, Take 500 mg by mouth 2 (two) times daily. (Patient taking differently: Take 500 mg by mouth 2 (two) times daily as needed.), Disp: , Rfl:   PHQ9 Today:    05/07/2024    8:54 AM  Depression screen PHQ 2/9  Decreased Interest 2  Down, Depressed, Hopeless 3  PHQ -  2 Score 5  Altered sleeping 2  Tired, decreased energy 3  Change in appetite 3  Feeling bad or failure about yourself  2  Trouble concentrating 2  Moving slowly or fidgety/restless 0  Suicidal thoughts 0  PHQ-9 Score 17  Difficult doing work/chores Very difficult   GAD7 Today:    05/07/2024    8:54 AM  GAD 7 : Generalized Anxiety Score  Nervous, Anxious, on Edge 2  Control/stop worrying 2  Worry too much - different things 3  Trouble relaxing 0  Restless 0  Easily annoyed or irritable 2  Afraid - awful might happen 2  Total GAD 7 Score 11   Anxiety Difficulty Somewhat difficult    OBJECTIVE: BP 110/78 (BP Location: Left Arm, Patient Position: Sitting, Cuff Size: Normal)   Pulse (!) 102   Temp 98.2 F (36.8 C) (Oral)   Resp 12   Ht 5' 3 (1.6 m)   Wt 206 lb 12.8 oz (93.8 kg)   LMP 04/27/2024   SpO2 99%   BMI 36.63 kg/m  General:  well developed, well nourished, in no apparent distress Skin:  no significant moles, warts, or growths Nose:  nares patent, septum midline, mucosa normal Throat/Pharynx:  lips and gingiva without lesion; tongue and uvula midline; non-inflamed pharynx; no exudates or postnasal drainage Lungs:  clear to auscultation, breath sounds equal bilaterally, no respiratory distress Cardio:  regular rate and rhythm, no LE edema or bruits Musculoskeletal:  symmetrical muscle groups noted without atrophy or deformity Neuro:  gait normal Psych: well oriented with normal range of affect and appropriate judgment/insight  ASSESSMENT/PLAN: Encounter to establish care  GAD (generalized anxiety disorder) - Plan: TSH, B12 and Folate Panel, Vitamin D  (25 hydroxy), Ambulatory referral to Behavioral Health  Prediabetes - Plan: HgB A1c  Other iron deficiency anemia - Plan: CBC, Iron, TIBC and Ferritin Panel, B12 and Folate Panel  Screening for cervical cancer - Plan: Ambulatory referral to Gynecology  Class 2 obesity without serious comorbidity with body mass index (BMI) of 36.0 to 36.9 in adult, unspecified obesity type - Plan: Amb Ref to Medical Weight Management  GAD Referral placed to behavioral health for comprehensive evaluation and medication management. Patient has longstanding anxiety and depression, questions prior mood disorder diagnosis. Psychiatric assessment recommended to clarify diagnoses and ensure optimal medicinal treatment.  Obesity Referral placed to [facility].  Dizziness Chronic issue.  Patient has history of IDA.  Update CBC today.  Patient reports inadequate  hydration during the day, advised to increase fluid intake to maintain proper hydration.  Consider ENT referral.  Pap-patient desires to see GYN, Dr. Rutherford.  She is working on getting records to her and will provide update on Pap via MyChart.  Patient instructed to sign release of records form from their previous PCP. The patient voiced understanding and agreement to the plan. Education provided today during visit and on AVS for patient to review at home.  Diet and Exercise recommendations provided.  Current diagnoses and recommendations discussed. HM recommendations reviewed with recommendations.    Patient should return  Return in about 6 weeks (around 06/18/2024), or CPE.   Harlene LITTIE Jolly, DNP, AGNP-C 05/07/24  9:37 AM

## 2024-05-08 LAB — IRON,TIBC AND FERRITIN PANEL
%SAT: 23 % (ref 16–45)
Ferritin: 24 ng/mL (ref 16–154)
Iron: 75 ug/dL (ref 40–190)
TIBC: 328 ug/dL (ref 250–450)

## 2024-05-09 ENCOUNTER — Encounter: Payer: Self-pay | Admitting: *Deleted

## 2024-05-12 ENCOUNTER — Ambulatory Visit (INDEPENDENT_AMBULATORY_CARE_PROVIDER_SITE_OTHER): Admitting: Family Medicine

## 2024-05-12 ENCOUNTER — Encounter: Payer: Self-pay | Admitting: Family Medicine

## 2024-05-12 VITALS — BP 120/79 | HR 102 | Temp 98.6°F | Ht 61.0 in | Wt 198.0 lb

## 2024-05-12 DIAGNOSIS — E559 Vitamin D deficiency, unspecified: Secondary | ICD-10-CM

## 2024-05-12 DIAGNOSIS — Z6837 Body mass index (BMI) 37.0-37.9, adult: Secondary | ICD-10-CM | POA: Diagnosis not present

## 2024-05-12 DIAGNOSIS — E66812 Obesity, class 2: Secondary | ICD-10-CM

## 2024-05-12 DIAGNOSIS — R7303 Prediabetes: Secondary | ICD-10-CM

## 2024-05-12 NOTE — Progress Notes (Signed)
 Office: [phone]  /  Fax: [phone]   Initial Visit  Chelsea Lowery was seen in clinic today to evaluate for obesity. She is interested in losing weight to improve overall health and reduce the risk of weight related complications. She presents today to review program treatment options, initial physical assessment, and evaluation.     She was referred by: PCP  When asked what else they would like to accomplish? She states: Adopt a healthier eating pattern and lifestyle, Improve energy levels and physical activity, Improve existing medical conditions, Improve quality of life, and Improve appearance  Weight history:  weight did go up after having kids, pre baby weight 180 lb.  She would like to be 140 lb.  Kids are now 2 and 71 yo.  Was active and fit in childhood.  Lives with fiance.   When asked how has your weight affected you? She states: Contributed to medical problems, Contributed to orthopedic problems or mobility issues, and Having fatigue  Some associated conditions: Prediabetes  Contributing factors: moderate to high levels of stress, reduced physical activity, and sedentary job  Weight promoting medications identified: None  Current nutrition plan: None  Current level of physical activity: Walking 30 minutes, three a week with weighted vest  Current or previous pharmacotherapy: Buproprion  Response to medication: Lost weight and was able to maintain weight loss   Past medical history includes:   Past Medical History:  Diagnosis Date   Sinusitis    Vertigo      Objective:   BP 120/79   Pulse (!) 102   Temp 98.6 F (37 C)   Ht 5' 1 (1.549 m)   Wt 198 lb (89.8 kg)   LMP 04/27/2024   SpO2 99%   BMI 37.41 kg/m  She was weighed on the bioimpedance scale: Body mass index is 37.41 kg/m.  Peak Weight:215 , Body Fat%:42.8, Visceral Fat Rating:9, Weight trend over the last 12 months: Decreasing  General:  Alert, oriented and cooperative. Patient is in no  acute distress.  Respiratory: Normal respiratory effort, no problems with respiration noted   Gait: able to ambulate independently  Mental Status: Normal mood and affect. Normal behavior. Normal judgment and thought content.   DIAGNOSTIC DATA REVIEWED:  BMET    Component Value Date/Time   NA 138 03/12/2011 2120   K 4.6 03/12/2011 2120   CL 102 03/12/2011 2120   CO2 23 03/12/2011 2120   GLUCOSE 93 03/12/2011 2120   BUN 15 03/12/2011 2120   CREATININE 0.50 03/12/2011 2120   CALCIUM 10.2 03/12/2011 2120   GFRNONAA NOT CALCULATED 03/12/2011 2120   GFRAA NOT CALCULATED 03/12/2011 2120   Lab Results  Component Value Date   HGBA1C 6.1 05/07/2024   No results found for: INSULIN CBC    Component Value Date/Time   WBC 5.8 05/07/2024 0944   RBC 4.84 05/07/2024 0944   HGB 12.9 05/07/2024 0944   HCT 39.9 05/07/2024 0944   PLT 358.0 05/07/2024 0944   MCV 82.5 05/07/2024 0944   MCH 26.1 04/04/2020 0436   MCHC 32.4 05/07/2024 0944   RDW 13.2 05/07/2024 0944   Iron/TIBC/Ferritin/ %Sat    Component Value Date/Time   IRON 75 05/07/2024 0944   TIBC 328 05/07/2024 0944   FERRITIN 24 05/07/2024 0944   IRONPCTSAT 23 05/07/2024 0944   Lipid Panel  No results found for: CHOL, TRIG, HDL, CHOLHDL, VLDL, LDLCALC, LDLDIRECT Hepatic Function Panel  No results found for: PROT, ALBUMIN, AST, ALT, ALKPHOS, BILITOT, BILIDIR,  IBILI    Component Value Date/Time   TSH 1.76 05/07/2024 0944     Assessment and Plan:   Prediabetes Diagnosis of prediabetes is now.  She denies a history of gestational diabetes.  She has to see improvements with dietary change and weight loss.  She is currently not on metformin.  Class 2 severe obesity due to excess calories with serious comorbidity and body mass index (BMI) of 37.0 to 37.9 in adult [facility]) Reviewed bioimpedance and program information.  Patient is ready to actively work on [employer].  Vitamin D   deficiency She has started vitamin D  50,000 IU once weekly for vitamin D  deficiency.  Energy levels remain low.       Obesity Treatment / Action Plan:  Patient will work on garnering support from family and friends to begin weight loss journey. Will work on eliminating or reducing the presence of highly palatable, calorie dense foods in the home. Will complete provided nutritional and psychosocial assessment questionnaire before the next appointment. Will be scheduled for indirect calorimetry to determine resting energy expenditure in a fasting state.  This will allow us  to create a reduced calorie, high-protein meal plan to promote loss of fat mass while preserving muscle mass. Will think about ideas on how to incorporate physical activity into their daily routine. Counseled on the health benefits of losing 5%-15% of total body weight. Was counseled on nutritional approaches to weight loss and benefits of reducing processed foods and consuming plant-based foods and high quality protein as part of nutritional weight management. Was counseled on pharmacotherapy and role as an adjunct in weight management.   Obesity Education Performed Today:  She was weighed on the bioimpedance scale and results were discussed and documented in the synopsis.  We discussed obesity as a disease and the importance of a more detailed evaluation of all the factors contributing to the disease.  We discussed the importance of long term lifestyle changes which include nutrition, exercise and behavioral modifications as well as the importance of customizing this to her specific health and social needs.  We discussed the benefits of reaching a healthier weight to alleviate the symptoms of existing conditions and reduce the risks of the biomechanical, metabolic and psychological effects of obesity.  Kaidance Delio appears to be in the action stage of change and states they are ready to start intensive lifestyle  modifications and behavioral modifications.  20 minutes was spent today on this visit including the above counseling, pre-visit chart review, and post-visit documentation.  Reviewed by clinician on day of visit: allergies, medications, problem list, medical history, surgical history, family history, social history, and previous encounter notes pertinent to obesity diagnosis.    Darice Haddock, D.O. DABFM, [facility] [address] [place], [place] [address] [phone]

## 2024-05-13 ENCOUNTER — Encounter: Payer: Self-pay | Admitting: *Deleted

## 2024-05-14 ENCOUNTER — Encounter: Payer: Self-pay | Admitting: Student

## 2024-05-16 ENCOUNTER — Ambulatory Visit: Admitting: Physician Assistant

## 2024-05-27 ENCOUNTER — Ambulatory Visit (INDEPENDENT_AMBULATORY_CARE_PROVIDER_SITE_OTHER): Admitting: Family Medicine

## 2024-05-27 ENCOUNTER — Encounter: Payer: Self-pay | Admitting: Family Medicine

## 2024-05-27 VITALS — BP 130/84 | HR 101 | Temp 98.3°F | Ht 61.0 in | Wt 195.0 lb

## 2024-05-27 DIAGNOSIS — F419 Anxiety disorder, unspecified: Secondary | ICD-10-CM

## 2024-05-27 DIAGNOSIS — E611 Iron deficiency: Secondary | ICD-10-CM | POA: Diagnosis not present

## 2024-05-27 DIAGNOSIS — F32A Depression, unspecified: Secondary | ICD-10-CM | POA: Diagnosis not present

## 2024-05-27 DIAGNOSIS — R5383 Other fatigue: Secondary | ICD-10-CM

## 2024-05-27 DIAGNOSIS — Z6836 Body mass index (BMI) 36.0-36.9, adult: Secondary | ICD-10-CM

## 2024-05-27 DIAGNOSIS — R7303 Prediabetes: Secondary | ICD-10-CM | POA: Diagnosis not present

## 2024-05-27 DIAGNOSIS — R0602 Shortness of breath: Secondary | ICD-10-CM

## 2024-05-27 DIAGNOSIS — E66812 Obesity, class 2: Secondary | ICD-10-CM

## 2024-05-27 DIAGNOSIS — Z72820 Sleep deprivation: Secondary | ICD-10-CM

## 2024-05-27 NOTE — Progress Notes (Signed)
 At a Glance:  Vitals Temp: 98.3 F (36.8 C) BP: 130/84 Pulse Rate: (!) 101 SpO2: 100 %   Anthropometric Measurements Height: 5' 1 (1.549 m) Weight: 195 lb (88.5 kg) BMI (Calculated): 36.86 Starting Weight: 195lb Peak Weight: 215lb   Body Composition  Body Fat %: 42 % Fat Mass (lbs): 82.2 lbs Muscle Mass (lbs): 107.6 lbs Total Body Water (lbs): 75 lbs Visceral Fat Rating : 9   Other Clinical Data RMR: 2246 Fasting: Yes Labs: Yes Today's Visit #: 1 Starting Date: 05/27/24    EKG: Normal sinus rhythm, rate 99.  Indirect Calorimeter completed today shows a VO2 of 325 and a REE of 2246.  Her calculated basal metabolic rate is 8374 thus her basal metabolic rate is better than expected.  Chief Complaint:  Obesity   Subjective:  Chelsea Lowery (MR# [id number]) is a 28 y.o. female who presents for evaluation and treatment of obesity and related comorbidities.   Chelsea Lowery is currently in the action stage of change and ready to dedicate time achieving and maintaining a healthier weight. Chelsea Lowery is interested in becoming our patient and working on intensive lifestyle modifications including (but not limited to) diet and exercise for weight loss.  Chelsea Lowery has been struggling with her weight. She has been unsuccessful in either losing weight, maintaining weight loss, or reaching her healthy weight goal.   She lives with her fiance, her 2 and 73 yo child.  She gained weight after having kids.  Prior to that, she was 180 lb and was active in dance.  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    Chelsea Lowery's habits were reviewed today and are as follows: Her family eats meals together, her desired weight loss is 40 lb, she snacks frequently in the evenings, she skips meals frequently, she is frequently drinking liquids with calories, and she struggles with emotional eating. She hs never used AOMs.  She is working out 2-3 days/ wk  (inconsistent now)  Other Fatigue Chelsea Lowery admits to daytime somnolence and admits to waking up still tired. Patient has a history of symptoms of morning fatigue. Chelsea Lowery generally gets 5 or 6 hours of sleep per night, and states that she has nightime awakenings. Snoring is not present. Apneic episodes are not present. Epworth Sleepiness Score is 12.   Shortness of Breath Chelsea Lowery notes increasing shortness of breath with exercising and seems to be worsening over time with weight gain. She notes getting out of breath sooner with activity than she used to. This has gotten worse recently. Chelsea Lowery denies shortness of breath at rest or orthopnea.   Depression Screen Chelsea Lowery's Food and Mood (modified PHQ-9) score was 19.     05/07/2024    8:54 AM  Depression screen PHQ 2/9  Decreased Interest 2  Down, Depressed, Hopeless 3  PHQ - 2 Score 5  Altered sleeping 2  Tired, decreased energy 3  Change in appetite 3  Feeling bad or failure about yourself  2  Trouble concentrating 2  Moving slowly or fidgety/restless 0  Suicidal thoughts 0  PHQ-9 Score 17  Difficult doing work/chores Very difficult     Assessment and Plan:   Other Fatigue Chelsea Lowery does feel that her weight is causing her energy to be lower than it should be. Fatigue may be related to obesity, depression or many other causes. Labs will be ordered, and in the meanwhile, Chelsea Lowery will focus on self care including making healthy food  choices, increasing physical activity and focusing on stress reduction.  Shortness of Breath Chelsea Lowery does feel that she gets out of breath more easily that she used to when she exercises. Chelsea Lowery's shortness of breath appears to be obesity related and exercise induced. She has agreed to work on weight loss and gradually increase exercise to treat her exercise induced shortness of breath. Will continue to monitor closely.  Chelsea Lowery had a positive depression screening. Depression is commonly  associated with obesity and often results in emotional eating behaviors. We will monitor this closely and work on CBT to help improve the non-hunger eating patterns. Referral to Psychology may be required if no improvement is seen as she continues in our clinic.    Problem List Items Addressed This Visit     Iron deficiency She has been taking OTC iron supplement.  She has secondary amenorrhea with [name] in and denies intake of a vegetarian or vegan diet. Lab Results  Component Value Date   IRON 75 05/07/2024   TIBC 328 05/07/2024   FERRITIN 24 05/07/2024      Prediabetes Lab Results  Component Value Date   HGBA1C 6.1 05/07/2024  She notes a fam hx of T2DM and is higher risk.  Denies personal hx of GDM She is not on metformin or other medication for blood sugar. Begin prescribed diet and begin active plan for weight reduction    Relevant Orders   Insulin , random   Comprehensive metabolic panel with GFR   Other Visit Diagnoses       SOBOE (shortness of breath on exertion)    -  Primary     Other fatigue       Relevant Orders   EKG 12-Lead (Completed)   Lipid panel   Comprehensive metabolic panel with GFR     Class 2 severe obesity due to excess calories with serious comorbidity and body mass index (BMI) of 36.0 to 36.9 in adult [facility])          Anxiety and depression     She feels more situational anxiety lately as she is applying to law school, working and balancing her 2 young kids and her fiance started having seizures a few months ago.  She isn't feeling much better on Lexapro 20 mg daily, Wellbutrin SR 300 mg daily by PCP.  Continue current meds for now.  Talk to PCP about medication management Begin working on self care, stress reduction and keeping junk food triggers out of the house to reduce emotional eating      Poor sleep  We discussed the importance of sleep in weight management She is averaging 5-6 hrs of sleep at night She appears to be lower risk for OSA and  will be working on improving sleep hygiene and reducing stress levels.          Chelsea Lowery is currently in the action stage of change and her goal is to continue with weight loss efforts. I recommend Chelsea Lowery begin the structured treatment plan as follows:  She has agreed to Category 3 Plan + 100 additional snack calories Can substitute out unsweet coffee + a protein shake for breakfast (not hungry in AM)  Exercise goals: All adults should avoid inactivity. Some activity is better than none, and adults who participate in any amount of physical activity, gain some health benefits.  Behavioral modification strategies:increasing lean protein intake, increase H2O intake, decrease liquid calories, increase high fiber foods, decreasing eating out, no skipping meals, meal planning and cooking strategies,  keeping healthy foods in the home, better snacking choices, and decrease junk food  Reduce intake of sweet products (honey, agave, smoothies, SSBs)  She was informed of the importance of frequent follow-up visits to maximize her success with intensive lifestyle modifications for her multiple health conditions. She was informed we would discuss her lab results at her next visit unless there is a critical issue that needs to be addressed sooner. Charmion agreed to keep her next visit at the agreed upon time to discuss these results.  Objective:  General: Cooperative, alert, well developed, in no acute distress. HEENT: Conjunctivae and lids unremarkable. Cardiovascular: Regular rhythm.  Lungs: Normal work of breathing. Neurologic: No focal deficits.   Lab Results  Component Value Date   CREATININE 0.50 03/12/2011   BUN 15 03/12/2011   NA 138 03/12/2011   K 4.6 03/12/2011   CL 102 03/12/2011   CO2 23 03/12/2011   No results found for: ALT, AST, GGT, ALKPHOS, BILITOT Lab Results  Component Value Date   HGBA1C 6.1 05/07/2024   No results found for: INSULIN  Lab Results  Component  Value Date   TSH 1.76 05/07/2024   No results found for: CHOL, HDL, LDLCALC, LDLDIRECT, TRIG, CHOLHDL Lab Results  Component Value Date   WBC 5.8 05/07/2024   HGB 12.9 05/07/2024   HCT 39.9 05/07/2024   MCV 82.5 05/07/2024   PLT 358.0 05/07/2024   Lab Results  Component Value Date   IRON 75 05/07/2024   TIBC 328 05/07/2024   FERRITIN 24 05/07/2024    Attestation Statements:  Reviewed by clinician on day of visit: allergies, medications, problem list, medical history, surgical history, family history, social history, and previous encounter notes.  Time spent on visit including pre-visit chart review and post-visit charting and face- to face care including nutritional counseling, review of EKG, interpretation of body composition scale and indirect calorimetry and nutrition prescription  was 40 minutes.   Darice Haddock, D.O. DABFM, DABOM [facility] [address] [place], [place] [address] [phone]

## 2024-05-28 ENCOUNTER — Ambulatory Visit: Payer: Self-pay | Admitting: Family Medicine

## 2024-05-28 ENCOUNTER — Encounter: Payer: Self-pay | Admitting: Student

## 2024-05-28 LAB — COMPREHENSIVE METABOLIC PANEL WITH GFR
ALT: 13 IU/L (ref 0–32)
AST: 18 IU/L (ref 0–40)
Albumin: 4.5 g/dL (ref 4.0–5.0)
Alkaline Phosphatase: 64 IU/L (ref 44–121)
BUN/Creatinine Ratio: 15 (ref 9–23)
BUN: 13 mg/dL (ref 6–20)
Bilirubin Total: 1.5 mg/dL — ABNORMAL HIGH (ref 0.0–1.2)
CO2: 20 mmol/L (ref 20–29)
Calcium: 9.5 mg/dL (ref 8.7–10.2)
Chloride: 102 mmol/L (ref 96–106)
Creatinine, Ser: 0.85 mg/dL (ref 0.57–1.00)
Globulin, Total: 3.1 g/dL (ref 1.5–4.5)
Glucose: 78 mg/dL (ref 70–99)
Potassium: 4.3 mmol/L (ref 3.5–5.2)
Sodium: 139 mmol/L (ref 134–144)
Total Protein: 7.6 g/dL (ref 6.0–8.5)
eGFR: 97 mL/min/1.73 (ref >59)

## 2024-05-28 LAB — INSULIN, RANDOM: INSULIN: 6.7 u[IU]/mL (ref 2.6–24.9)

## 2024-05-28 LAB — LIPID PANEL
Chol/HDL Ratio: 3.9 ratio (ref 0.0–4.4)
Cholesterol, Total: 152 mg/dL (ref 100–199)
HDL: 39 mg/dL — ABNORMAL LOW (ref >39)
LDL Chol Calc (NIH): 97 mg/dL (ref 0–99)
Triglycerides: 86 mg/dL (ref 0–149)
VLDL Cholesterol Cal: 16 mg/dL (ref 5–40)

## 2024-06-10 ENCOUNTER — Ambulatory Visit: Admitting: Family Medicine

## 2024-06-16 ENCOUNTER — Ambulatory Visit: Payer: Self-pay

## 2024-06-16 NOTE — Telephone Encounter (Signed)
 Appt scheduled

## 2024-06-16 NOTE — Telephone Encounter (Signed)
 FYI Only or Action Required?: Action required by provider: medication refill request and clinical question for provider.  Patient was last seen in primary care on 05/27/2024 by Waylan Darice BRAVO, DO.  Called Nurse Triage reporting Dizziness.  Symptoms began several days ago.  Interventions attempted: Nothing.  Symptoms are: unchanged.  Triage Disposition: See PCP When Office is Open (Within 3 Days)  Patient/caregiver understands and will follow disposition?: Yes  Copied from CRM [id number]. Topic: Clinical - Red Word Triage >> Jun 16, 2024 11:21 AM Harlene ORN wrote: Red Word that prompted transfer to Nurse Triage: chronic vertigo is starting back up again prescribed medication is not working Reason for Disposition  [1] MODERATE dizziness (e.g., vertigo; feels very unsteady, interferes with normal activities) AND [2] has been evaluated by doctor (or NP/PA) for this  Answer Assessment - Initial Assessment Questions No available appts today. Advised UC today, ED if symptoms worsen. Pt already has appt 06/18/24. Patient requesting medication for Vertigo; no med on file.  1. DESCRIPTION: Describe your dizziness.     Having to lay down,room spinning, no matter if I lay down, drink water, and sleep; still wakes up dizziness 2. VERTIGO: Do you feel like either you or the room is spinning or tilting?      Chronic; last taken Friday, out of medication, Meclizine; but prescribed medications not working. Migraine cooling relief patches seem to help some; OTC 3. LIGHTHEADED: Do you feel lightheaded? (e.g., somewhat faint, woozy, weak upon standing)     Yes, that's why I have to lay down; sometimes see little flashes of light 4. SEVERITY: How bad is it?  Can you walk?     severe 5. ONSET:  When did the dizziness begin?     Saturday 6. AGGRAVATING FACTORS: Does anything make it worse? (e.g., standing, change in head position)     standing 7. CAUSE: What do you think is causing the  dizziness?     Vertigo dx 8. RECURRENT SYMPTOM: Have you had dizziness before? If Yes, ask: When was the last time? What happened that time?     Last episode Saturday 9. OTHER SYMPTOMS: Do you have any other symptoms? (e.g., earache, headache, numbness, tinnitus, vomiting, weakness)     Nausea, HA(Excedrin helps), vomiting,  10. PREGNANCY: Is there any chance you are pregnant? When was your last menstrual period?       no  Protocols used: Dizziness - Vertigo-A-AH

## 2024-06-17 ENCOUNTER — Ambulatory Visit: Admitting: Family Medicine

## 2024-06-17 DIAGNOSIS — Z6837 Body mass index (BMI) 37.0-37.9, adult: Secondary | ICD-10-CM | POA: Insufficient documentation

## 2024-06-17 DIAGNOSIS — Z0000 Encounter for general adult medical examination without abnormal findings: Secondary | ICD-10-CM | POA: Insufficient documentation

## 2024-06-17 DIAGNOSIS — F419 Anxiety disorder, unspecified: Secondary | ICD-10-CM | POA: Insufficient documentation

## 2024-06-17 NOTE — Progress Notes (Deleted)
 Subjective:     Patient ID: Chelsea Lowery, female    DOB: 06-May-1996, 28 y.o.   MRN: [id number]  No chief complaint on file.   HPI  Discussed the use of AI scribe software for clinical note transcription with the patient, who gave verbal consent to proceed.   Presents with complaints of vertigo she has a history of vertigo and has been assessed by neurology in the past.  Last MRI was in 2018 and was within normal limits.  History of Present Illness              Health Maintenance Due  Topic Date Due   Cervical Cancer Screening (Pap smear)  Never done   Influenza Vaccine  04/18/2024    Past Medical History:  Diagnosis Date   Anxiety and depression    GAD (generalized anxiety disorder)    IDA (iron deficiency anemia)    Prediabetes 2025   Sinusitis    Vertigo     Past Surgical History:  Procedure Laterality Date   CESAREAN SECTION  2022   CESAREAN SECTION  2023   CHOLECYSTECTOMY  2022   MOUTH SURGERY     OPEN REDUCTION INTERNAL FIXATION (ORIF) HAND     ORTHOPEDIC SURGERY Left 2009   arm    Family History  Problem Relation Age of Onset   Anxiety disorder Mother    Hypertension Mother    Hypertension Father    Anxiety disorder Sister    Asthma Maternal Grandmother    Hypertension Maternal Grandmother    Diabetes Maternal Grandmother    Asthma Paternal Grandmother    Asthma Maternal Aunt    Hypertension Maternal Aunt    Asthma Maternal Uncle     Social History   Socioeconomic History   Marital status: Single    Spouse name: Fiance   Number of children: 2   Years of education: Not on file   Highest education level: Master's degree (e.g., MA, MS, MEng, MEd, MSW, MBA)  Occupational History   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  Tobacco Use   Smoking status: Never   Smokeless tobacco: Never  Vaping Use   Vaping status: Never Used  Substance and Sexual Activity   Alcohol use: Not Currently   Drug use: Not Currently     Types: Marijuana    Comment: last used approx  03-06-20   Sexual activity: Yes    Birth control/protection: None  Other Topics Concern   Not on file  Social History Narrative   Lives with parents    Caffeine use: 1 per week   Right handed   Social Drivers of Health   Financial Resource Strain: Low Risk  (10/30/2023)   Received from [facility]   Overall Financial Resource Strain (CARDIA)    Difficulty of Paying Living Expenses: Not hard at all  Food Insecurity: No Food Insecurity (10/30/2023)   [facility]   Hunger Vital Sign    Within the past 12 months, you worried that your food would run out before you got the money to buy more.: Never true    Within the past 12 months, the food you bought just didn't last and you didn't have money to get more.: Never true  Transportation Needs: No Transportation Needs (10/30/2023)   Received from [facility] - Transportation    Lack of Transportation (Medical): No    Lack of Transportation (Non-Medical): No  Physical Activity: Insufficiently Active (10/30/2023)  Received from [facility]   Exercise Vital Sign    On average, how many days per week do you engage in moderate to strenuous exercise (like a brisk walk)?: 1 day    On average, how many minutes do you engage in exercise at this level?: 30 min  Stress: No Stress Concern Present (10/30/2023)   Received from [facility] - Occupational Stress Questionnaire    Feeling of Stress : Not at all  Social Connections: Moderately Integrated (10/30/2023)   Received from [facility]   Social Network    How would you rate your social network (family, work, friends)?: Adequate participation with social networks  Intimate Partner Violence: Not At Risk (10/30/2023)   Received from [facility]   HITS    Over the last 12 months how often did your partner physically hurt you?: Never    Over the last 12 months how often did  your partner insult you or talk down to you?: Never    Over the last 12 months how often did your partner threaten you with physical harm?: Never    Over the last 12 months how often did your partner scream or curse at you?: Never    Outpatient Medications Prior to Visit  Medication Sig Dispense Refill   buPROPion (WELLBUTRIN SR) 150 MG 12 hr tablet Take 300 mg by mouth every morning.     Cholecalciferol (VITAMIN D3) 25 MCG (1000 UT) CAPS Take 1 capsule (1,000 Units total) by mouth daily. 90 capsule 0   escitalopram (LEXAPRO) 20 MG tablet Take 20 mg by mouth daily.     levonorgestrel (MIRENA) 20 MCG/DAY [name] 1 each by Intrauterine route once.     traZODone (DESYREL) 50 MG tablet Take 25-100 mg by mouth at bedtime.     valACYclovir (VALTREX) 500 MG tablet Take 500 mg by mouth 2 (two) times daily. (Patient taking differently: Take 500 mg by mouth 2 (two) times daily as needed.)     Vitamin D , Ergocalciferol , (DRISDOL ) 1.25 MG (50000 UNIT) CAPS capsule Take 1 capsule (50,000 Units total) by mouth every 7 (seven) days. 8 capsule 0   No facility-administered medications prior to visit.    Allergies  Allergen Reactions   Clindamycin/Lincomycin    Doxycycline  Nausea And Vomiting    ROS   See HPI Objective:    Physical Exam  General: No acute distress. Awake and conversant.  Eyes: Normal conjunctiva, anicteric. Round symmetric pupils.  ENT: Hearing grossly intact. No nasal discharge.  Neck: Neck is supple. No masses or thyromegaly.  Respiratory: CTAB. Respirations are non-labored. No wheezing.  Skin: Warm. No rashes or ulcers.  Psych: Alert and oriented. Cooperative, Appropriate mood and affect, Normal judgment.  CV: RRR. No murmur. No lower extremity edema.  MSK: Normal ambulation. No clubbing or cyanosis.  Neuro:  CN II-XII grossly normal.    There were no vitals taken for this visit. Wt Readings from Last 3 Encounters:  05/27/24 195 lb (88.5 kg)  05/12/24 198 lb (89.8 kg)   05/07/24 206 lb 12.8 oz (93.8 kg)       Assessment & Plan:   Problem List Items Addressed This Visit   None   I am having Areyana Olexa maintain her valACYclovir, buPROPion, escitalopram, levonorgestrel, traZODone, Vitamin D  (Ergocalciferol ), and Vitamin D3.  No orders of the defined types were placed in this encounter.

## 2024-06-17 NOTE — Assessment & Plan Note (Signed)
 Asymptomatic. Last CBC and iron panel stable.

## 2024-06-17 NOTE — Assessment & Plan Note (Signed)
 hgba1c acceptable, minimize simple carbs. Increase exercise as tolerated. No current medications.

## 2024-06-17 NOTE — Progress Notes (Unsigned)
 Subjective:     Patient ID: Chelsea Lowery, female    DOB: 1996/06/30, 28 y.o.   MRN: [id number]  No chief complaint on file.   HPI  Discussed the use of AI scribe software for clinical note transcription with the patient, who gave verbal consent to proceed.   LMP: heavy periods?;  [name]-Mirena  Anxiety and Depression-on Wellbutrin on Wellbutrin and Lexapro  Vitamin D  deficiency-vitamin D  1000 units daily  Insomnia-trazodone 25 to 100 mg p.o. bedtime   HCM Pap: due Immunizations: Influenza due  History of Present Illness              Health Maintenance Due  Topic Date Due   Cervical Cancer Screening (Pap smear)  Never done   Influenza Vaccine  04/18/2024    Past Medical History:  Diagnosis Date   Anxiety and depression    GAD (generalized anxiety disorder)    IDA (iron deficiency anemia)    Prediabetes 2025   Sinusitis    Vertigo     Past Surgical History:  Procedure Laterality Date   CESAREAN SECTION  2022   CESAREAN SECTION  2023   CHOLECYSTECTOMY  2022   MOUTH SURGERY     OPEN REDUCTION INTERNAL FIXATION (ORIF) HAND     ORTHOPEDIC SURGERY Left 2009   arm    Family History  Problem Relation Age of Onset   Anxiety disorder Mother    Hypertension Mother    Hypertension Father    Anxiety disorder Sister    Asthma Maternal Grandmother    Hypertension Maternal Grandmother    Diabetes Maternal Grandmother    Asthma Paternal Grandmother    Asthma Maternal Aunt    Hypertension Maternal Aunt    Asthma Maternal Uncle     Social History   Socioeconomic History   Marital status: Single    Spouse name: Fiance   Number of children: 2   Years of education: Not on file   Highest education level: Master's degree (e.g., MA, MS, MEng, MEd, MSW, MBA)  Occupational History   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  Tobacco Use   Smoking status: Never   Smokeless tobacco: Never  Vaping Use   Vaping status: Never Used  Substance  and Sexual Activity   Alcohol use: Not Currently   Drug use: Not Currently    Types: Marijuana    Comment: last used approx  03-06-20   Sexual activity: Yes    Birth control/protection: None  Other Topics Concern   Not on file  Social History Narrative   Lives with parents    Caffeine use: 1 per week   Right handed   Social Drivers of Health   Financial Resource Strain: Low Risk  (10/30/2023)   Received from [facility]   Overall Financial Resource Strain (CARDIA)    Difficulty of Paying Living Expenses: Not hard at all  Food Insecurity: No Food Insecurity (10/30/2023)   Received from [facility]   Hunger Vital Sign    Within the past 12 months, you worried that your food would run out before you got the money to buy more.: Never true    Within the past 12 months, the food you bought just didn't last and you didn't have money to get more.: Never true  Transportation Needs: No Transportation Needs (10/30/2023)   Received from [facility] - Transportation    Lack of Transportation (Medical): No    Lack of Transportation (Non-Medical): No  Physical Activity: Insufficiently Active (10/30/2023)   Received from [facility]   Exercise Vital Sign    On average, how many days per week do you engage in moderate to strenuous exercise (like a brisk walk)?: 1 day    On average, how many minutes do you engage in exercise at this level?: 30 min  Stress: No Stress Concern Present (10/30/2023)   Received from [facility] - Occupational Stress Questionnaire    Feeling of Stress : Not at all  Social Connections: Moderately Integrated (10/30/2023)   Received from [facility]   Social Network    How would you rate your social network (family, work, friends)?: Adequate participation with social networks  Intimate Partner Violence: Not At Risk (10/30/2023)   Received from [facility]   HITS    Over the last 12 months how often did your  partner physically hurt you?: Never    Over the last 12 months how often did your partner insult you or talk down to you?: Never    Over the last 12 months how often did your partner threaten you with physical harm?: Never    Over the last 12 months how often did your partner scream or curse at you?: Never    Outpatient Medications Prior to Visit  Medication Sig Dispense Refill   buPROPion (WELLBUTRIN SR) 150 MG 12 hr tablet Take 300 mg by mouth every morning.     Cholecalciferol (VITAMIN D3) 25 MCG (1000 UT) CAPS Take 1 capsule (1,000 Units total) by mouth daily. 90 capsule 0   escitalopram (LEXAPRO) 20 MG tablet Take 20 mg by mouth daily.     levonorgestrel (MIRENA) 20 MCG/DAY [name] 1 each by Intrauterine route once.     traZODone (DESYREL) 50 MG tablet Take 25-100 mg by mouth at bedtime.     valACYclovir (VALTREX) 500 MG tablet Take 500 mg by mouth 2 (two) times daily. (Patient taking differently: Take 500 mg by mouth 2 (two) times daily as needed.)     Vitamin D , Ergocalciferol , (DRISDOL ) 1.25 MG (50000 UNIT) CAPS capsule Take 1 capsule (50,000 Units total) by mouth every 7 (seven) days. 8 capsule 0   No facility-administered medications prior to visit.    Allergies  Allergen Reactions   Clindamycin/Lincomycin    Doxycycline  Nausea And Vomiting    ROS     Objective:    Physical Exam Constitutional:      General: She is not in acute distress.    Appearance: She is obese. She is not ill-appearing, toxic-appearing or diaphoretic.  HENT:     Head: Normocephalic and atraumatic.     Right Ear: Tympanic membrane, ear canal and external ear normal.     Left Ear: Tympanic membrane, ear canal and external ear normal.     Nose: Nose normal. No congestion.     Mouth/Throat:     Mouth: Mucous membranes are moist.     Pharynx: Oropharynx is clear.  Eyes:     Extraocular Movements: Extraocular movements intact.     Right eye: Normal extraocular motion.     Left eye: Normal extraocular  motion.     Conjunctiva/sclera: Conjunctivae normal.     Pupils: Pupils are equal, round, and reactive to light.  Neck:     Thyroid : No thyroid  mass or thyromegaly.     Vascular: No carotid bruit or JVD.  Cardiovascular:     Rate and Rhythm: Normal rate and regular rhythm.  Pulses: Normal pulses.          Radial pulses are 2+ on the right side and 2+ on the left side.       Dorsalis pedis pulses are 2+ on the right side and 2+ on the left side.     Heart sounds: Normal heart sounds, S1 normal and S2 normal. No murmur heard.    No friction rub. No gallop.  Pulmonary:     Effort: Pulmonary effort is normal. No respiratory distress.     Breath sounds: Normal breath sounds.  Abdominal:     General: Bowel sounds are normal. There is no distension.     Palpations: Abdomen is soft.     Tenderness: There is no abdominal tenderness. There is no guarding.  Musculoskeletal:        General: Normal range of motion.     Cervical back: Full passive range of motion without pain and normal range of motion. No edema or erythema.     Right lower leg: No edema.     Left lower leg: No edema.  Lymphadenopathy:     Cervical: No cervical adenopathy.  Skin:    General: Skin is warm and dry.     Capillary Refill: Capillary refill takes less than 2 seconds.  Neurological:     General: No focal deficit present.     Mental Status: She is alert and oriented to person, place, and time.     Cranial Nerves: No cranial nerve deficit.     Motor: No weakness.     Coordination: Coordination normal.     Gait: Gait normal.     Deep Tendon Reflexes: Reflexes normal.  Psychiatric:        Mood and Affect: Mood normal.        Behavior: Behavior normal.        Thought Content: Thought content normal.      There were no vitals taken for this visit. Wt Readings from Last 3 Encounters:  05/27/24 195 lb (88.5 kg)  05/12/24 198 lb (89.8 kg)  05/07/24 206 lb 12.8 oz (93.8 kg)       Assessment & Plan:    Problem List Items Addressed This Visit     Annual visit for general adult medical examination without abnormal findings - Primary   Patient encouraged to maintain heart healthy diet, regular exercise, adequate sleep. Consider daily probiotics. Take medications as prescribed.        Anxiety and depression   Stable on Wellbutrin and Lexapro.  Referral has been referral has been sent to behavioral health [facility] for medication management. Denies SI/HI.      Class 2 severe obesity due to excess calories with serious comorbidity and body mass index (BMI) of 37.0 to 37.9 in adult   Iron deficiency   Asymptomatic. Last CBC and iron panel stable.       Prediabetes   hgba1c acceptable, minimize simple carbs. Increase exercise as tolerated. No current medications.       I am having Chelsea Lowery maintain her valACYclovir, buPROPion, escitalopram, levonorgestrel, traZODone, Vitamin D  (Ergocalciferol ), and Vitamin D3.  No orders of the defined types were placed in this encounter.

## 2024-06-17 NOTE — Assessment & Plan Note (Signed)
 Stable on Wellbutrin and Lexapro.  Referral has been referral has been sent to behavioral health [facility] for medication management. Denies SI/HI.

## 2024-06-17 NOTE — Assessment & Plan Note (Signed)
 Patient encouraged to maintain heart healthy diet, regular exercise, adequate sleep. Consider daily probiotics. Take medications as prescribed

## 2024-06-18 ENCOUNTER — Encounter: Payer: Self-pay | Admitting: Student

## 2024-06-18 ENCOUNTER — Ambulatory Visit (INDEPENDENT_AMBULATORY_CARE_PROVIDER_SITE_OTHER): Admitting: Student

## 2024-06-18 ENCOUNTER — Telehealth: Payer: Self-pay | Admitting: *Deleted

## 2024-06-18 VITALS — BP 116/80 | HR 96 | Temp 98.7°F | Resp 12 | Ht 61.0 in | Wt 198.4 lb

## 2024-06-18 DIAGNOSIS — F419 Anxiety disorder, unspecified: Secondary | ICD-10-CM

## 2024-06-18 DIAGNOSIS — F908 Attention-deficit hyperactivity disorder, other type: Secondary | ICD-10-CM

## 2024-06-18 DIAGNOSIS — E611 Iron deficiency: Secondary | ICD-10-CM

## 2024-06-18 DIAGNOSIS — Z0000 Encounter for general adult medical examination without abnormal findings: Secondary | ICD-10-CM

## 2024-06-18 DIAGNOSIS — R7303 Prediabetes: Secondary | ICD-10-CM

## 2024-06-18 DIAGNOSIS — F32A Depression, unspecified: Secondary | ICD-10-CM

## 2024-06-18 DIAGNOSIS — E559 Vitamin D deficiency, unspecified: Secondary | ICD-10-CM | POA: Insufficient documentation

## 2024-06-18 DIAGNOSIS — Z23 Encounter for immunization: Secondary | ICD-10-CM | POA: Diagnosis not present

## 2024-06-18 DIAGNOSIS — E66812 Obesity, class 2: Secondary | ICD-10-CM

## 2024-06-18 DIAGNOSIS — Z6837 Body mass index (BMI) 37.0-37.9, adult: Secondary | ICD-10-CM

## 2024-06-18 MED ORDER — ESCITALOPRAM OXALATE 20 MG PO TABS
20.0000 mg | ORAL_TABLET | Freq: Every day | ORAL | 1 refills | Status: AC
Start: 2024-06-18 — End: ?

## 2024-06-18 NOTE — Assessment & Plan Note (Signed)
 Working with [facility]. Encouraged DASH or MIND diet, decrease po intake and increase exercise as tolerated. Needs 7-8 hours of sleep nightly. Avoid trans fats, eat small, frequent meals every 4-5 hours with lean proteins, complex carbs and healthy fats. Minimize simple carbs, high fat foods and processed foods

## 2024-06-18 NOTE — Progress Notes (Signed)
 Subjective:     Patient ID: Chelsea Lowery, female    DOB: 1996/07/27, 28 y.o.   MRN: [id number]  Chief Complaint  Patient presents with   Annual Exam   Dizziness    HPI  Discussed the use of AI scribe software for clinical note transcription with the patient, who gave verbal consent to proceed.  History of Present Illness         Discussed the use of AI scribe software for clinical note transcription with the patient, who gave verbal consent to proceed.  History of Present Illness Chelsea Lowery is a 28 year old female who presents with symptoms of depression and anxiety.  She experiences decreased appetite and lacks motivation to exercise despite working with a health and wellness coach. She is currently taking Lexapro and Wellbutrin but questions their effectiveness, feeling overwhelmed and unable to manage basic self-care tasks. Extreme fatigue persists despite low activity levels. Sleep is challenging, often requiring Unisom, with typical sleep from 11:30 PM to 5:00 AM.  She has had three major panic attacks, the most recent two weeks ago, with symptoms of hyperventilation, crying, and inability to speak. 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 Her mind races constantly, making it difficult to focus.  She experiences increased vertigo, sometimes alleviated by ear cleaning, but dizziness persists. Her ears feel full, which she associates with the dizziness. She takes vitamin D  supplements, having completed a high-dose regimen and continuing with a daily dose of 1000 IU, which has helped reduce sluggishness. Her last menstrual period was on a recent Sunday, and she notices spotting when stressed. She is due for a Pap smear update.  Assessment and Plan Adult Wellness Visit Managing multiple responsibilities affecting focus and motivation. - Continue with health and wellness coach for  diet and exercise.  LMP: [name]-Mirena  Anxiety and Depression-on Wellbutrin and Lexapro Currently in Therapy   Vitamin D deficiency-vitamin D 1000 units daily  Insomnia-trazodone 25 to 100 mg p.o. bedtime  HCM Pap: due Immunizations: Influenza due  Patient denies fever, chills, SOB, CP, palpitations, dyspnea, edema, HA, vision changes, N/V/D, abdominal pain, urinary symptoms, rash, weight changes, and recent illness or hospitalizations.      06/18/2024   10:24 AM 05/07/2024    8:54 AM  GAD 7 : Generalized Anxiety Score  Nervous, Anxious, on Edge 1 2  Control/stop worrying 2 2  Worry too much - different things 3 3  Trouble relaxing 1 0  Restless 0 0  Easily annoyed or irritable 2 2  Afraid - awful might happen 0 2  Total GAD 7 Score 9 11  Anxiety Difficulty Very difficult Somewhat difficult        10 /09/2023   10:24 AM 05/07/2024    8:54 AM  Depression screen PHQ 2/9  Decreased Interest 2 2  Down, Depressed, Hopeless 3 3  PHQ - 2 Score 5 5  Altered sleeping 2 2  Tired, decreased energy 3 3  Change in appetite 3 3  Feeling bad or failure about yourself  2 2  Trouble concentrating 2 2  Moving slowly or fidgety/restless 1 0  Suicidal thoughts 0 0  PHQ-9 Score 18 17  Difficult doing work/chores Very difficult Very difficult        Health Maintenance Due  Topic Date Due   Cervical Cancer Screening (Pap smear)  Never done    Past Medical History:  Diagnosis Date  Anxiety and depression    GAD (generalized anxiety disorder)    IDA (iron deficiency anemia)    Prediabetes 2025   Sinusitis    Vertigo     Past Surgical History:  Procedure Laterality Date   CESAREAN SECTION  2022   CESAREAN SECTION  2023   CHOLECYSTECTOMY  2022   MOUTH SURGERY     OPEN REDUCTION INTERNAL FIXATION (ORIF) HAND     ORTHOPEDIC SURGERY Left 2009   arm    Family History  Problem Relation Age of Onset   Anxiety disorder Mother    Hypertension Mother    Hypertension Father     Anxiety disorder Sister    Asthma Maternal Grandmother    Hypertension Maternal Grandmother    Diabetes Maternal Grandmother    Asthma Paternal Grandmother    Asthma Maternal Aunt    Hypertension Maternal Aunt    Asthma Maternal Uncle     Social History   Socioeconomic History   Marital status: Single    Spouse name: Fiance   Number of children: 2   Years of education: Not on file   Highest education level: Master's degree (e.g., MA, MS, MEng, MEd, MSW, MBA)  Occupational History   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    Employer: [employer]  Tobacco Use   Smoking status: Never   Smokeless tobacco: Never  Vaping Use   Vaping status: Never Used  Substance and Sexual Activity   Alcohol use: Not Currently   Drug use: Not Currently    Types: Marijuana    Comment: last used approx  03-06-20   Sexual activity: Yes    Birth control/protection: None  Other Topics Concern   Not on file  Social History Narrative   Lives with parents    Caffeine use: 1 per week   Right handed   Social Drivers of Health   Financial Resource Strain: Low Risk  (10/30/2023)   Received from [facility]   Overall Financial Resource Strain (CARDIA)    Difficulty of Paying Living Expenses: Not hard at all  Food Insecurity: No Food Insecurity (10/30/2023)   Received from [facility]   Hunger Vital Sign    Within the past 12 months, you worried that your food would run out before you got the money to buy more.: Never true    Within the past 12 months, the food you bought just didn't last and you didn't have money to get more.: Never true  Transportation Needs: No Transportation Needs (10/30/2023)   Received from [facility] - Transportation    Lack of Transportation (Medical): No    Lack of Transportation (Non-Medical): No  Physical Activity: Insufficiently Active (10/30/2023)   Received from [facility]   Exercise Vital Sign    On average, how many days per week  do you engage in moderate to strenuous exercise (like a brisk walk)?: 1 day    On average, how many minutes do you engage in exercise at this level?: 30 min  Stress: No Stress Concern Present (10/30/2023)   Received from [facility] - Occupational Stress Questionnaire    Feeling of Stress : Not at all  Social Connections: Moderately Integrated (10/30/2023)   Received from [facility]   Social Network    How would you rate your social network (family, work, friends)?: Adequate participation with social networks  Intimate Partner Violence: Not At Risk (10/30/2023)   Received  from [facility]   HITS    Over the last 12 months how often did your partner physically hurt you?: Never    Over the last 12 months how often did your partner insult you or talk down to you?: Never    Over the last 12 months how often did your partner threaten you with physical harm?: Never    Over the last 12 months how often did your partner scream or curse at you?: Never    Outpatient Medications Prior to Visit  Medication Sig Dispense Refill   buPROPion (WELLBUTRIN SR) 150 MG 12 hr tablet Take 300 mg by mouth every morning.     Cholecalciferol (VITAMIN D3) 25 MCG (1000 UT) CAPS Take 1 capsule (1,000 Units total) by mouth daily. 90 capsule 0   levonorgestrel (MIRENA) 20 MCG/DAY [name] 1 each by Intrauterine route once.     traZODone (DESYREL) 50 MG tablet Take 25-100 mg by mouth at bedtime.     valACYclovir (VALTREX) 500 MG tablet Take 500 mg by mouth 2 (two) times daily. (Patient taking differently: Take 500 mg by mouth 2 (two) times daily as needed.)     Vitamin D , Ergocalciferol , (DRISDOL ) 1.25 MG (50000 UNIT) CAPS capsule Take 1 capsule (50,000 Units total) by mouth every 7 (seven) days. 8 capsule 0   escitalopram (LEXAPRO) 20 MG tablet Take 20 mg by mouth daily.     No facility-administered medications prior to visit.    Allergies  Allergen Reactions    Clindamycin/Lincomycin    Doxycycline  Nausea And Vomiting    ROS    See HPI Objective:    Physical Exam Constitutional:      General: She is not in acute distress.    Appearance: She is obese. She is not ill-appearing, toxic-appearing or diaphoretic.  HENT:     Head: Normocephalic and atraumatic.     Right Ear: Tympanic membrane, ear canal and external ear normal.     Left Ear: Tympanic membrane, ear canal and external ear normal.     Nose: Nose normal. No congestion.     Mouth/Throat:     Mouth: Mucous membranes are moist.     Pharynx: Oropharynx is clear.  Eyes:     Extraocular Movements: Extraocular movements intact.     Right eye: Normal extraocular motion.     Left eye: Normal extraocular motion.     Conjunctiva/sclera: Conjunctivae normal.     Pupils: Pupils are equal, round, and reactive to light.  Neck:     Thyroid : No thyroid  mass or thyromegaly.     Vascular: No carotid bruit or JVD.  Cardiovascular:     Rate and Rhythm: Normal rate and regular rhythm.     Pulses: Normal pulses.          Radial pulses are 2+ on the right side and 2+ on the left side.       Dorsalis pedis pulses are 2+ on the right side and 2+ on the left side.     Heart sounds: Normal heart sounds, S1 normal and S2 normal. No murmur heard.    No friction rub. No gallop.  Pulmonary:     Effort: Pulmonary effort is normal. No respiratory distress.     Breath sounds: Normal breath sounds.  Abdominal:     General: Bowel sounds are normal. There is no distension.     Palpations: Abdomen is soft.     Tenderness: There is no abdominal tenderness. There is no guarding.  Musculoskeletal:        General: Normal range of motion.     Cervical back: Full passive range of motion without pain and normal range of motion. No edema or erythema.     Right lower leg: No edema.     Left lower leg: No edema.  Lymphadenopathy:     Cervical: No cervical adenopathy.  Skin:    General: Skin is warm and dry.      Capillary Refill: Capillary refill takes less than 2 seconds.  Neurological:     General: No focal deficit present.     Mental Status: She is alert and oriented to person, place, and time.     Cranial Nerves: No cranial nerve deficit.     Motor: No weakness.     Coordination: Coordination normal.     Gait: Gait normal.     Deep Tendon Reflexes: Reflexes normal.  Psychiatric:        Mood and Affect: Mood normal.        Behavior: Behavior normal.        Thought Content: Thought content normal.      BP 116/80 (BP Location: Left Arm, Patient Position: Sitting, Cuff Size: Large)   Pulse 96   Temp 98.7 F (37.1 C) (Oral)   Resp 12   Ht 5' 1 (1.549 m)   Wt 198 lb 6.4 oz (90 kg)   LMP 06/15/2024   SpO2 97%   BMI 37.49 kg/m  Wt Readings from Last 3 Encounters:  06/18/24 198 lb 6.4 oz (90 kg)  05/27/24 195 lb (88.5 kg)  05/12/24 198 lb (89.8 kg)       Assessment & Plan:   Problem List Items Addressed This Visit     Annual visit for general adult medical examination without abnormal findings - Primary   Patient encouraged to maintain heart healthy diet, regular exercise, adequate sleep. Consider daily probiotics. Take medications as prescribed.    General Health Maintenance Due for Pap smear update. - Refer to gynecology for Pap smear. Immunization: Influenza given today      Anxiety and depression   On Wellbutrin and Lexapro. Trazodone HS prn for sleep. PHQ9 and GAD7 scored elevated. Rx- Lexapro refilled  Pt reports she is attending therapy but unsure if she wants to stay with her current counselor. List of counseling services provided.  Denies SI/HI.  Need for psychiatric assessment for medication efficacy and mood disorder evaluation. Referral sent to behavioral health for further evaluation and medication management.  - Consider ADD testing referral if indicated by psychiatry.         Relevant Medications   escitalopram (LEXAPRO) 20 MG tablet   Attention deficit  disorder   Relevant Orders   Ambulatory referral to Psychology   Class 2 severe obesity due to excess calories with serious comorbidity and body mass index (BMI) of 37.0 to 37.9 in adult   Working with [facility]. Encouraged DASH or MIND diet, decrease po intake and increase exercise as tolerated. Needs 7-8 hours of sleep nightly. Avoid trans fats, eat small, frequent meals every 4-5 hours with lean proteins, complex carbs and healthy fats. Minimize simple carbs, high fat foods and processed foods        Iron deficiency   Asymptomatic. Last CBC and iron panel stable.       Prediabetes   hgba1c acceptable, minimize simple carbs. Increase exercise as tolerated. No current medications.      Vitamin D  deficiency  On high-dose vitamin D  regimen currently and 1,0000 units daily Vit D. Follow-up level check planned. - RTC for vitamin D  level check in two months, lab appointment.      Relevant Orders   Vitamin D  (25 hydroxy)   Other Visit Diagnoses       Influenza vaccine administered       Relevant Orders   Flu vaccine trivalent PF, 6mos and older(Flulaval,Afluria,Fluarix,Fluzone) (Completed)      FU 6 months   I have changed Charee Parton's escitalopram. I am also having her maintain her valACYclovir, buPROPion, levonorgestrel, traZODone, Vitamin D  (Ergocalciferol ), and Vitamin D3.  Meds ordered this encounter  Medications   escitalopram (LEXAPRO) 20 MG tablet    Sig: Take 1 tablet (20 mg total) by mouth daily.    Dispense:  90 tablet    Refill:  1    Supervising Provider:   DOMENICA BLACKBIRD A [[id number]]

## 2024-06-18 NOTE — Assessment & Plan Note (Signed)
 On high-dose vitamin D  regimen currently and 1,0000 units daily Vit D. Follow-up level check planned. - RTC for vitamin D  level check in two months, lab appointment.

## 2024-06-18 NOTE — Telephone Encounter (Addendum)
 Can we follow up on referral to GYN please?

## 2024-06-20 ENCOUNTER — Ambulatory Visit: Admitting: Student

## 2024-06-20 DIAGNOSIS — R42 Dizziness and giddiness: Secondary | ICD-10-CM

## 2024-06-29 ENCOUNTER — Other Ambulatory Visit: Payer: Self-pay | Admitting: Student

## 2024-06-29 DIAGNOSIS — E559 Vitamin D deficiency, unspecified: Secondary | ICD-10-CM

## 2024-06-30 ENCOUNTER — Ambulatory Visit (HOSPITAL_COMMUNITY): Admitting: Family

## 2024-07-09 ENCOUNTER — Ambulatory Visit
Admission: EM | Admit: 2024-07-09 | Discharge: 2024-07-09 | Disposition: A | Discharge status: Home / Self Care | Attending: Family Medicine | Admitting: Family Medicine

## 2024-07-09 DIAGNOSIS — M5442 Lumbago with sciatica, left side: Secondary | ICD-10-CM | POA: Diagnosis not present

## 2024-07-09 DIAGNOSIS — M549 Dorsalgia, unspecified: Secondary | ICD-10-CM | POA: Diagnosis not present

## 2024-07-09 MED ORDER — NAPROXEN 500 MG PO TABS: 500.0000 mg | ORAL_TABLET | Freq: Two times a day (BID) | ORAL | 0 refills | Status: AC | PRN

## 2024-07-09 MED ORDER — METHOCARBAMOL 500 MG PO TABS: 500.0000 mg | ORAL_TABLET | Freq: Two times a day (BID) | ORAL | 0 refills | Status: AC | PRN

## 2024-07-09 NOTE — ED Triage Notes (Signed)
 Pt present with c/o back pain. States the lt side of shoulder and arm feels like it is on fire. States she wants something to help with pain. Reports she feels her arm is inflamed.
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
## 2024-07-09 NOTE — Discharge Instructions (Signed)
 He may start Robaxin twice daily as needed.  Please note this medication make you drowsy.  Do not drink alcohol or drive on this medication.  You may take naproxen twice daily as needed as well.  Heat to the back and lots of rest.  Please follow-up with your PCP if your symptoms do not improve.  Please go to the ER if you develop any worsening symptoms.  I hope you feel better soon!

## 2024-07-28 ENCOUNTER — Ambulatory Visit: Admitting: Family Medicine

## 2024-08-06 ENCOUNTER — Ambulatory Visit: Admitting: Student

## 2024-08-06 ENCOUNTER — Encounter: Payer: Self-pay | Admitting: Student

## 2024-08-06 DIAGNOSIS — Z91199 Patient's noncompliance with other medical treatment and regimen due to unspecified reason: Secondary | ICD-10-CM

## 2024-08-06 NOTE — Progress Notes (Signed)
 PT NOT CALL/ NO SHOW

## 2024-08-13 ENCOUNTER — Encounter (HOSPITAL_BASED_OUTPATIENT_CLINIC_OR_DEPARTMENT_OTHER): Payer: Self-pay | Admitting: Emergency Medicine

## 2024-08-13 ENCOUNTER — Emergency Department (HOSPITAL_BASED_OUTPATIENT_CLINIC_OR_DEPARTMENT_OTHER)

## 2024-08-13 ENCOUNTER — Other Ambulatory Visit: Payer: Self-pay

## 2024-08-13 ENCOUNTER — Emergency Department (HOSPITAL_BASED_OUTPATIENT_CLINIC_OR_DEPARTMENT_OTHER)
Admission: EM | Admit: 2024-08-13 | Discharge: 2024-08-13 | Disposition: A | Discharge status: Home / Self Care | Attending: Emergency Medicine | Admitting: Emergency Medicine

## 2024-08-13 DIAGNOSIS — R1013 Epigastric pain: Secondary | ICD-10-CM | POA: Diagnosis not present

## 2024-08-13 DIAGNOSIS — R112 Nausea with vomiting, unspecified: Secondary | ICD-10-CM | POA: Diagnosis not present

## 2024-08-13 DIAGNOSIS — R1030 Lower abdominal pain, unspecified: Secondary | ICD-10-CM | POA: Diagnosis not present

## 2024-08-13 DIAGNOSIS — R1084 Generalized abdominal pain: Secondary | ICD-10-CM | POA: Diagnosis present

## 2024-08-13 DIAGNOSIS — Z79899 Other long term (current) drug therapy: Secondary | ICD-10-CM | POA: Diagnosis not present

## 2024-08-13 LAB — URINALYSIS, ROUTINE W REFLEX MICROSCOPIC
Bacteria, UA: NONE SEEN
Bilirubin Urine: NEGATIVE
Glucose, UA: NEGATIVE mg/dL
Hgb urine dipstick: NEGATIVE
Ketones, ur: NEGATIVE mg/dL
Leukocytes,Ua: NEGATIVE
Nitrite: NEGATIVE
Specific Gravity, Urine: 1.025 (ref 1.005–1.030)
pH: 6 (ref 5.0–8.0)

## 2024-08-13 LAB — COMPREHENSIVE METABOLIC PANEL WITH GFR
ALT: 11 U/L (ref 0–44)
AST: 20 U/L (ref 15–41)
Albumin: 4.3 g/dL (ref 3.5–5.0)
Alkaline Phosphatase: 65 U/L (ref 38–126)
Anion gap: 11 (ref 5–15)
BUN: 11 mg/dL (ref 6–20)
CO2: 26 mmol/L (ref 22–32)
Calcium: 9.9 mg/dL (ref 8.9–10.3)
Chloride: 102 mmol/L (ref 98–111)
Creatinine, Ser: 0.73 mg/dL (ref 0.44–1.00)
GFR, Estimated: >60 mL/min (ref >60)
Glucose, Bld: 79 mg/dL (ref 70–99)
Potassium: 3.8 mmol/L (ref 3.5–5.1)
Sodium: 139 mmol/L (ref 135–145)
Total Bilirubin: 1.2 mg/dL (ref 0.0–1.2)
Total Protein: 7.9 g/dL (ref 6.5–8.1)

## 2024-08-13 LAB — CBG MONITORING, ED
Glucose-Capillary: 53 mg/dL — ABNORMAL LOW (ref 70–99)
Glucose-Capillary: 74 mg/dL (ref 70–99)

## 2024-08-13 LAB — CBC
HCT: 40.9 % (ref 36.0–46.0)
Hemoglobin: 13.3 g/dL (ref 12.0–15.0)
MCH: 27 pg (ref 26.0–34.0)
MCHC: 32.5 g/dL (ref 30.0–36.0)
MCV: 83 fL (ref 80.0–100.0)
Platelets: 332 K/uL (ref 150–400)
RBC: 4.93 MIL/uL (ref 3.87–5.11)
RDW: 12.8 % (ref 11.5–15.5)
WBC: 6.9 K/uL (ref 4.0–10.5)
nRBC: 0 % (ref 0.0–0.2)

## 2024-08-13 LAB — LIPASE, BLOOD: Lipase: 12 U/L (ref 11–51)

## 2024-08-13 LAB — PREGNANCY, URINE: Preg Test, Ur: NEGATIVE

## 2024-08-13 LAB — OCCULT BLOOD X 1 CARD TO LAB, STOOL: Fecal Occult Bld: NEGATIVE

## 2024-08-13 MED ORDER — ONDANSETRON HCL 4 MG/2ML IJ SOLN
4.0000 mg | Freq: Once | INTRAMUSCULAR | Status: AC
  Administered 2024-08-13: 4 mg via INTRAVENOUS
  Filled 2024-08-13: qty 2

## 2024-08-13 MED ORDER — IOHEXOL 300 MG/ML  SOLN
100.0000 mL | Freq: Once | INTRAMUSCULAR | Status: AC | PRN
  Administered 2024-08-13: 100 mL via INTRAVENOUS

## 2024-08-13 MED ORDER — LACTATED RINGERS IV BOLUS
1000.0000 mL | Freq: Once | INTRAVENOUS | Status: AC
  Administered 2024-08-13: 1000 mL via INTRAVENOUS

## 2024-08-13 NOTE — ED Provider Notes (Signed)
 [place] EMERGENCY DEPARTMENT AT [facility] Provider Note   CSN: [id number] Arrival date & time: 08/13/24  1058     Patient presents with: Abdominal Pain   Chelsea Lowery is a 28 y.o. female.   Patient c/o abd pain in past 1-2 weeks. Dull pain mid abdomen, non radiating, without specific exacerbating or alleviating factors. Has been having normal bms, no severe diarrhea or constipation. +nausea/vomiting. Emesis not bloody or bilious. Indicates stool has been dark/black, and wiped and saw small amount dark blood this AM. No dysuria or hematuria. No vaginal discharge or bleeding. No hx gi bleeding, diverticula, or pud. No faintness. No fevers. Remote hx cholecystectomy.  No vaginal discharge or bleeding.   The history is provided by the patient and medical records.  Abdominal Pain Associated symptoms: nausea and vomiting   Associated symptoms: no chest pain, no chills, no cough, no dysuria, no fever, no shortness of breath, no sore throat, no vaginal bleeding and no vaginal discharge        Prior to Admission medications   Medication Sig Start Date End Date Taking? Authorizing Provider  buPROPion (WELLBUTRIN SR) 150 MG 12 hr tablet Take 300 mg by mouth every morning.    [provider]  Cholecalciferol (VITAMIN D3) 25 MCG (1000 UT) CAPS Take 1 capsule (1,000 Units total) by mouth daily. 05/07/24   Yacopino, Jessica L, NP  escitalopram  (LEXAPRO ) 20 MG tablet Take 1 tablet (20 mg total) by mouth daily. 06/18/24   Yacopino, Jessica L, NP  levonorgestrel (MIRENA) 20 MCG/DAY [name] 1 each by Intrauterine route once. 05/29/22   [provider]  methocarbamol  (ROBAXIN ) 500 MG tablet Take 1 tablet (500 mg total) by mouth 2 (two) times daily as needed for muscle spasms. 07/09/24   Mayer, Jodi R, NP  naproxen  (NAPROSYN ) 500 MG tablet Take 1 tablet (500 mg total) by mouth 2 (two) times daily as needed. 07/09/24   Mayer, Jodi R, NP  traZODone (DESYREL) 50 MG tablet Take  25-100 mg by mouth at bedtime.    [provider]  valACYclovir (VALTREX) 500 MG tablet Take 500 mg by mouth 2 (two) times daily. Patient taking differently: Take 500 mg by mouth 2 (two) times daily as needed.    [provider]  Vitamin D , Ergocalciferol , (DRISDOL ) 1.25 MG (50000 UNIT) CAPS capsule TAKE 1 CAPSULE (50,000 UNITS TOTAL) BY MOUTH EVERY 7 (SEVEN) DAYS 06/30/24   Wheeler Harlene CROME, NP    Allergies: Clindamycin/lincomycin and Doxycycline     Review of Systems  Constitutional:  Negative for chills and fever.  HENT:  Negative for sore throat.   Eyes:  Negative for redness.  Respiratory:  Negative for cough and shortness of breath.   Cardiovascular:  Negative for chest pain.  Gastrointestinal:  Positive for abdominal pain, nausea and vomiting.  Genitourinary:  Negative for dysuria, flank pain, vaginal bleeding and vaginal discharge.  Musculoskeletal:  Negative for back pain and neck pain.  Neurological:  Negative for syncope and headaches.    Updated Vital Signs BP (!) 155/87 (BP Location: Right Arm)   Pulse 66   Temp 98.3 F (36.8 C) (Oral)   Resp 16   SpO2 100%   Physical Exam Vitals and nursing note reviewed.  Constitutional:      Appearance: Normal appearance. She is well-developed.  HENT:     Head: Atraumatic.     Nose: Nose normal.     Mouth/Throat:     Mouth: Mucous membranes are moist.  Eyes:  General: No scleral icterus.    Conjunctiva/sclera: Conjunctivae normal.  Neck:     Trachea: No tracheal deviation.  Cardiovascular:     Rate and Rhythm: Normal rate and regular rhythm.     Pulses: Normal pulses.     Heart sounds: Normal heart sounds. No murmur heard.    No friction rub. No gallop.  Pulmonary:     Effort: Pulmonary effort is normal. No respiratory distress.     Breath sounds: Normal breath sounds.  Abdominal:     General: Bowel sounds are normal. There is no distension.     Palpations: Abdomen is soft. There is no mass.      Tenderness: There is abdominal tenderness. There is no guarding or rebound.     Hernia: No hernia is present.     Comments: Mild epigastric and mid abd tenderness. No pelvic pain/tenderness.   Genitourinary:    Comments: No cva tenderness.  Rectal exam (EMT chaperone, Jenna)  - no external hemorrhoid or fissure noted, no rectal mass felt. Scant stool is light yellowish/brown in color, no gross blood or melena noted. Musculoskeletal:        General: No swelling.     Cervical back: Normal range of motion and neck supple. No rigidity. No muscular tenderness.  Skin:    General: Skin is warm and dry.     Findings: No rash.  Neurological:     Mental Status: She is alert.     Comments: Alert, speech normal.   Psychiatric:        Mood and Affect: Mood normal.     (all labs ordered are listed, but only abnormal results are displayed) Results for orders placed or performed during the hospital encounter of 08/13/24  Urinalysis, Routine w reflex microscopic -Urine, Clean Catch   Collection Time: 08/13/24 11:41 AM  Result Value Ref Range   Color, Urine YELLOW YELLOW   APPearance CLEAR CLEAR   Specific Gravity, Urine 1.025 1.005 - 1.030   pH 6.0 5.0 - 8.0   Glucose, UA NEGATIVE NEGATIVE mg/dL   Hgb urine dipstick NEGATIVE NEGATIVE   Bilirubin Urine NEGATIVE NEGATIVE   Ketones, ur NEGATIVE NEGATIVE mg/dL   Protein, ur TRACE (A) NEGATIVE mg/dL   Nitrite NEGATIVE NEGATIVE   Leukocytes,Ua NEGATIVE NEGATIVE   RBC / HPF 0-5 0 - 5 RBC/hpf   WBC, UA 0-5 0 - 5 WBC/hpf   Bacteria, UA NONE SEEN NONE SEEN   Squamous Epithelial / HPF 0-5 0 - 5 /HPF   Mucus PRESENT   Pregnancy, urine   Collection Time: 08/13/24 11:41 AM  Result Value Ref Range   Preg Test, Ur NEGATIVE NEGATIVE  CBC   Collection Time: 08/13/24 12:27 PM  Result Value Ref Range   WBC 6.9 4.0 - 10.5 K/uL   RBC 4.93 3.87 - 5.11 MIL/uL   Hemoglobin 13.3 12.0 - 15.0 g/dL   HCT 59.0 63.9 - 53.9 %   MCV 83.0 80.0 - 100.0 fL   MCH  27.0 26.0 - 34.0 pg   MCHC 32.5 30.0 - 36.0 g/dL   RDW 87.1 88.4 - 84.4 %   Platelets 332 150 - 400 K/uL   nRBC 0.0 0.0 - 0.2 %  Comprehensive metabolic panel with GFR   Collection Time: 08/13/24 12:27 PM  Result Value Ref Range   Sodium 139 135 - 145 mmol/L   Potassium 3.8 3.5 - 5.1 mmol/L   Chloride 102 98 - 111 mmol/L   CO2 26 22 -  32 mmol/L   Glucose, Bld 79 70 - 99 mg/dL   BUN 11 6 - 20 mg/dL   Creatinine, Ser 9.26 0.44 - 1.00 mg/dL   Calcium 9.9 8.9 - 89.6 mg/dL   Total Protein 7.9 6.5 - 8.1 g/dL   Albumin 4.3 3.5 - 5.0 g/dL   AST 20 15 - 41 U/L   ALT 11 0 - 44 U/L   Alkaline Phosphatase 65 38 - 126 U/L   Total Bilirubin 1.2 0.0 - 1.2 mg/dL   GFR, Estimated >39 >39 mL/min   Anion gap 11 5 - 15  Lipase, blood   Collection Time: 08/13/24 12:27 PM  Result Value Ref Range   Lipase 12 11 - 51 U/L  Occult blood card to lab, stool   Collection Time: 08/13/24 12:41 PM  Result Value Ref Range   Fecal Occult Bld NEGATIVE NEGATIVE  CBG monitoring, ED   Collection Time: 08/13/24 12:55 PM  Result Value Ref Range   Glucose-Capillary 53 (L) 70 - 99 mg/dL  CBG monitoring, ED   Collection Time: 08/13/24  3:06 PM  Result Value Ref Range   Glucose-Capillary 74 70 - 99 mg/dL     EKG: None  Radiology: CT ABDOMEN PELVIS W CONTRAST Result Date: 08/13/2024 CLINICAL DATA:  Lower abdominal pain, dark stools for 2 weeks EXAM: CT ABDOMEN AND PELVIS WITH CONTRAST TECHNIQUE: Multidetector CT imaging of the abdomen and pelvis was performed using the standard protocol following bolus administration of intravenous contrast. RADIATION DOSE REDUCTION: This exam was performed according to the departmental dose-optimization program which includes automated exposure control, adjustment of the mA and/or kV according to patient size and/or use of iterative reconstruction technique. CONTRAST:  OMNIPAQUE  IOHEXOL  300 MG/ML  SOLN COMPARISON:  None Available. FINDINGS: Lower chest: No acute pleural  or parenchymal lung disease. Hepatobiliary: No focal liver abnormality is seen. Status post cholecystectomy. No biliary dilatation. Pancreas: Unremarkable. No pancreatic ductal dilatation or surrounding inflammatory changes. Spleen: Normal in size without focal abnormality. Adrenals/Urinary Tract: No urinary tract calculi or obstructive uropathy within either kidney. Normal enhancement the renal parenchyma. The adrenals and bladder are unremarkable. Stomach/Bowel: No bowel obstruction or ileus. Normal appendix right lower quadrant. No bowel wall thickening or inflammatory change. Vascular/Lymphatic: No significant vascular findings are present. No enlarged abdominal or pelvic lymph nodes. Reproductive: [name] within the endometrial cavity. The uterus and adnexal structures are unremarkable. Other: No free fluid or free intraperitoneal gas. Small fat containing umbilical hernia. No bowel herniation. Musculoskeletal: No acute or destructive bony abnormalities. Reconstructed images demonstrate no additional findings. IMPRESSION: 1. No acute intra-abdominal or intrapelvic process. Electronically Signed   By: Ozell Daring M.D.   On: 08/13/2024 15:04     Procedures   Medications Ordered in the ED  lactated ringers  bolus 1,000 mL (0 mLs Intravenous Stopped 08/13/24 1311)  ondansetron  (ZOFRAN ) injection 4 mg (4 mg Intravenous Given 08/13/24 1235)  iohexol  (OMNIPAQUE ) 300 MG/ML solution 100 mL (100 mLs Intravenous Contrast Given 08/13/24 1332)                                    Medical Decision Making Problems Addressed: Generalized abdominal pain: acute illness or injury with systemic symptoms that poses a threat to life or bodily functions Nausea and vomiting in adult: acute illness or injury with systemic symptoms that poses a threat to life or bodily functions  Amount and/or Complexity of Data  Reviewed External Data Reviewed: notes. Labs: ordered. Decision-making details documented in ED  Course. Radiology: ordered and independent interpretation performed. Decision-making details documented in ED Course.  Risk Prescription drug management. Parenteral controlled substances. Decision regarding hospitalization.   Iv ns. Labs ordered/sent. Imaging ordered.   Differential diagnosis includes gi bleeding, gastritis, gastroenteritis, etc. Dispo decision including potential need for admission considered - will get labs and imaging and reassess.   Reviewed nursing notes and prior charts for additional history. External reports reviewed.   Labs reviewed/interpreted by me - wbc and hgb normal. Chem normal. Ua neg for uti. Preg neg.   CT reviewed/interpreted by me - no acute infxn.   Pt appears stable for ED d/c.   Rec close pcp f/u.  Return precautions provided.       Final diagnoses:  Generalized abdominal pain  Nausea and vomiting in adult    ED Discharge Orders     None          Bernard Drivers, MD 08/13/24 [phone]

## 2024-08-13 NOTE — ED Triage Notes (Signed)
 C/o lower abd pain w/ dark black stools x 2 week.

## 2024-08-13 NOTE — Discharge Instructions (Addendum)
 It was our pleasure to provide your ER care today - we hope that you feel better.  Overall, your ER tests look good. Your blood count is normal.   Drink plenty of fluids/stay well hydrated.   Follow up with primary care doctor in the next 2-3 days if symptoms fail to improve/resolve.  Return to ER if worse, new symptoms, fevers, new or worsening or severe abdominal pain, persistent vomiting, or other concern.

## 2024-08-13 NOTE — ED Notes (Signed)
 Aware and informed of the CBG... Provider cleared food/drink to see if it improves.

## 2024-08-13 NOTE — ED Notes (Signed)
 DC paperwork given and verbally understood.
